# Patient Record
Sex: Male | Born: 1970
Health system: Southern US, Community
[De-identification: ages and names within clinical notes are randomized; demographics above are authoritative.]

## PROBLEM LIST (undated history)

## (undated) DIAGNOSIS — G4730 Sleep apnea, unspecified: Secondary | ICD-10-CM

## (undated) DIAGNOSIS — I10 Essential (primary) hypertension: Secondary | ICD-10-CM

## (undated) HISTORY — DX: Sleep apnea, unspecified: G47.30

---

## 2015-12-05 ENCOUNTER — Emergency Department
Admission: EM | Admit: 2015-12-05 | Discharge: 2015-12-05 | Disposition: A | Discharge status: Home / Self Care | Payer: Worker's Compensation | Attending: Emergency Medicine | Admitting: Emergency Medicine

## 2015-12-05 ENCOUNTER — Encounter: Payer: Self-pay | Admitting: *Deleted

## 2015-12-05 DIAGNOSIS — H5711 Ocular pain, right eye: Secondary | ICD-10-CM | POA: Diagnosis present

## 2015-12-05 DIAGNOSIS — Y9389 Activity, other specified: Secondary | ICD-10-CM | POA: Insufficient documentation

## 2015-12-05 DIAGNOSIS — Y929 Unspecified place or not applicable: Secondary | ICD-10-CM | POA: Insufficient documentation

## 2015-12-05 DIAGNOSIS — T1501XA Foreign body in cornea, right eye, initial encounter: Secondary | ICD-10-CM | POA: Diagnosis not present

## 2015-12-05 DIAGNOSIS — Y999 Unspecified external cause status: Secondary | ICD-10-CM | POA: Diagnosis not present

## 2015-12-05 DIAGNOSIS — W458XXA Other foreign body or object entering through skin, initial encounter: Secondary | ICD-10-CM | POA: Diagnosis not present

## 2015-12-05 MED ORDER — FLUORESCEIN SODIUM 1 MG OP STRP
1.0000 | ORAL_STRIP | Freq: Once | OPHTHALMIC | Status: AC
  Administered 2015-12-05: 1 via OPHTHALMIC

## 2015-12-05 NOTE — ED Notes (Signed)
Right eye pain and redness. Thinks he got something in it on Friday.

## 2015-12-05 NOTE — Discharge Instructions (Signed)
Corneal Abrasion The cornea is the clear covering at the front and center of the eye. When you look at the colored portion of the eye, you are looking through the cornea. It is a thin tissue made up of layers. The top layer is the most sensitive layer. A corneal abrasion happens if this layer is scratched or an injury causes it to come off.  HOME CARE  You may be given drops or a medicated cream. Use the medicine as told by your doctor.  A pressure patch may be put over the eye. If this is done, follow your doctor's instructions for when to remove the patch. Do not drive or use machines while the eye patch is on. Judging distances is hard to do with a patch on.  See your doctor for a follow-up exam if you are told to do so. It is very important that you keep this appointment. GET HELP IF:   You have pain, are sensitive to light, and have a scratchy feeling in one eye or both eyes.  Your pressure patch keeps getting loose. You can blink your eye under the patch.  You have fluid coming from your eye or the lids stick together in the morning.  You have the same symptoms in the morning that you did with the first abrasion. This could be days, weeks, or months after the first abrasion healed.   This information is not intended to replace advice given to you by your health care provider. Make sure you discuss any questions you have with your health care provider.   Document Released: 12/20/2007 Document Revised: 03/24/2015 Document Reviewed: 03/10/2013 Elsevier Interactive Patient Education 2016 Elsevier Inc[employer]  Eye Foreign Body A foreign body is an object on or in the eye that should not be there. The object could be a speck of dirt or dust, a hair, an eyelash, a splinter, or any other object. HOME CARE  Take medicines only as told by your doctor. Use eye drops or ointment as told.  If no eye patch was put on:  Keep the eye closed as much as possible.  Do not rub the eye.  Wear dark  glasses in bright light.  Do not wear contact lenses until the eye feels normal, or as told by your doctor.  Wear protective eye covering when needed, especially when using high-speed tools.  If your eye is patched:  Follow your doctor's instructions for when to remove the patch.  Do notdrive or use machines while the eye patch is on. Judging distances is hard to do while wearing a patch.  Keep all follow-up visits as told by your doctor. This is important. GET HELP IF:   Your pain gets worse.  Your vision gets worse.  You have problems with your eye patch.  You have fluid (discharge) coming from your eye.  You have redness and swelling around your eye. MAKE SURE YOU:   Understand these instructions.  Will watch your condition.  Will get help right away if you are not doing well or get worse.   This information is not intended to replace advice given to you by your health care provider. Make sure you discuss any questions you have with your health care provider.   Document Released: 12/21/2009 Document Revised: 07/24/2014 Document Reviewed: 11/28/2012 Elsevier Interactive Patient Education 2016 [employer].   You have a retained metallic foreign body to the cornea. Follow-up with Dr. Inez Pilgrim, directly for further treatment. Report to the back door  of the office and await the provider.

## 2015-12-05 NOTE — ED Notes (Signed)
Patient c/o right eye pain that began at work on Friday and has progressed since. Patient c/o photophobia and dried secretions in the morning.

## 2015-12-09 NOTE — ED Provider Notes (Signed)
[facility] Emergency Department Provider Note ____________________________________________  Time seen: [id number]  I have reviewed the triage vital signs and the nursing notes.  HISTORY  Chief Complaint  Eye Pain   HPI Kyle Miles is a 45 y.o. male presents to the ED with right eye pain with onset on Friday, 2 days prior to arrival. The patient describes he was working when he got an acute foreign body sensation to the right eye. He attempted unsuccessfully to flush the eye and has had ongoing, intermittent eye irritation, tearing, redness, and photophobia. He denies any headache pain, nausea, vomiting, visual disturbance. He rates his discomfort at 8/10 in triage.  History reviewed. No pertinent past medical history.  There are no active problems to display for this patient.  History reviewed. No pertinent past surgical history.  No current outpatient prescriptions on file.  Allergies Review of patient's allergies indicates no known allergies.  No family history on file.  Social History Social History  Substance Use Topics  . Smoking status: Never Smoker   . Smokeless tobacco: None  . Alcohol Use: No    Review of Systems  Constitutional: Negative for fever. Eyes: Negative for visual changes. Right eye FBS.  ENT: Negative for sore throat. Skin: Negative for rash. Neurological: Negative for headaches, focal weakness or numbness. ____________________________________________  PHYSICAL EXAM:  VITAL SIGNS: ED Triage Vitals  Enc Vitals Group     BP 12/05/15 1457 140/105 mmHg     Pulse Rate 12/05/15 1457 78     Resp 12/05/15 1457 18     Temp 12/05/15 1457 98.9 F (37.2 C)     Temp Source 12/05/15 1457 Oral     SpO2 12/05/15 1457 97 %     Weight 12/05/15 1457 265 lb (120.203 kg)     Height 12/05/15 1457 5\' 9"  (1.753 m)     Head Cir --      Peak Flow --      Pain Score 12/05/15 1458 8     Pain Loc --      Pain Edu? --      Excl. in GC? --      Constitutional: Alert and oriented. Well appearing and in no distress. Head: Normocephalic and atraumatic. Eyes: Conjunctivae are normal except on the right. PERRL. Normal extraocular movements. Right eye with a obvious retained foreign body to the cornea. After several drops of tetracaine. The foreign body is partially removed using a definite, sterile cotton swab. An unsuccessful attempt using the bevel of an 18-gauge needle is rendered. Musculoskeletal: Nontender with normal range of motion in all extremities.  Neurologic:  Normal gait without ataxia. Normal speech and language. No gross focal neurologic deficits are appreciated. Skin:  Skin is warm, dry and intact. No rash noted. ____________________________________________  PROCEDURES  Tetracaine iii gtts OD ____________________________________________  INITIAL IMPRESSION / ASSESSMENT AND PLAN / ED COURSE  Patient with 2 days of foreign body sensation to the right eye. He has a gross foreign body to the cornea on the right. Unsuccessful attempts to lift the foreign body in the ED. Patient will be evaluated directly by Dr. Inez PilgrimBrasington at [facility]. Patient is discharged with directions to report to the office for further management.  ____________________________________________  FINAL CLINICAL IMPRESSION(S) / ED DIAGNOSES  Final diagnoses:  Corneal foreign body with residual material, right, initial encounter     Lissa HoardJenise V Bacon Reisha Wos, PA-C 12/09/15 1855  Jene Everyobert Kinner, MD 12/10/15 1743

## 2016-02-16 ENCOUNTER — Ambulatory Visit: Payer: Self-pay | Admitting: Urology

## 2016-02-22 ENCOUNTER — Encounter: Payer: Self-pay | Admitting: Urology

## 2016-02-22 ENCOUNTER — Ambulatory Visit (INDEPENDENT_AMBULATORY_CARE_PROVIDER_SITE_OTHER): Payer: BLUE CROSS/BLUE SHIELD | Admitting: Urology

## 2016-02-22 VITALS — BP 145/85 | HR 76 | Ht 69.0 in | Wt 267.5 lb

## 2016-02-22 DIAGNOSIS — E291 Testicular hypofunction: Secondary | ICD-10-CM

## 2016-02-22 NOTE — Progress Notes (Signed)
02/22/2016 3:33 PM   Kyle LauthGreg Miles 13-Apr-1971 [id number]  Referring provider: Derwood KaplanErnest B Eason, MD [address] [place], [place] [address]  Chief Complaint  Patient presents with  . Erectile Dysfunction    HPI: Kyle Dan HumphreysMebane is a 45yo seen in consultation today for  Erectile dysfunction. It has been present for the past 6-7 months. He gets fatigued easily. He had testosterone drawn and per patient is was around 130.  He has issues getting and maintaining an erection. He has decreased libido.  He does not have an issue with ejaculation.  He has issue with nocturia 2-3x. He drinks 8oz within 2 hours of bedtime. He has not been checked for sleep apnea.  He takes diet pill on occasion but he has not taken any in the past week.  No family hx of CAD.   PMH: No past medical history on file.  Surgical History: No past surgical history on file.  Home Medications:    Medication List       Accurate as of 02/22/16  3:33 PM. Always use your most recent med list.          Cinnamon 500 MG capsule Take 500 mg by mouth daily.   Fish Oil 1000 MG Caps Take by mouth.   multivitamin tablet Take 1 tablet by mouth daily.   phentermine 37.5 MG capsule Take 37.5 mg by mouth every morning.       Allergies: No Known Allergies  Family History: Family History  Problem Relation Age of Onset  . Kidney cancer Neg Hx   . Kidney disease Neg Hx   . Prostate cancer Neg Hx     Social History:  reports that he has never smoked. He does not have any smokeless tobacco history on file. He reports that he does not drink alcohol. His drug history is not on file.  ROS: UROLOGY Frequent Urination?: Yes Hard to postpone urination?: No Burning/pain with urination?: No Get up at night to urinate?: Yes Leakage of urine?: No Urine stream starts and stops?: Yes Trouble starting stream?: No Do you have to strain to urinate?: No Blood in urine?: No Urinary tract infection?: No Sexually transmitted  disease?: No Injury to kidneys or bladder?: No Painful intercourse?: No Weak stream?: No Erection problems?: Yes Penile pain?: No  Gastrointestinal Nausea?: No Vomiting?: No Indigestion/heartburn?: No Diarrhea?: No Constipation?: No  Constitutional Fever: No Night sweats?: No Weight loss?: No Fatigue?: Yes  Skin Skin rash/lesions?: No Itching?: No  Eyes Blurred vision?: No Double vision?: No  Ears/Nose/Throat Sore throat?: No Sinus problems?: No  Hematologic/Lymphatic Swollen glands?: No Easy bruising?: No  Cardiovascular Leg swelling?: No Chest pain?: No  Respiratory Cough?: No Shortness of breath?: No  Endocrine Excessive thirst?: No  Musculoskeletal Back pain?: Yes Joint pain?: Yes  Neurological Headaches?: No Dizziness?: No  Psychologic Depression?: No Anxiety?: No  Physical Exam: BP (!) 145/85   Pulse 76   Ht 5\' 9"  (1.753 m)   Wt 121.3 kg (267 lb 8 oz)   BMI 39.50 kg/m   Constitutional:  Alert and oriented, No acute distress. HEENT: [place] AT, moist mucus membranes.  Trachea midline, no masses. Cardiovascular: No clubbing, cyanosis, or edema. Respiratory: Normal respiratory effort, no increased work of breathing. GI: Abdomen is soft, nontender, nondistended, no abdominal masses GU: No CVA tenderness. Circumcised phallus. No masses/lesions on testes/penis/scrotum Skin: No rashes, bruises or suspicious lesions. Lymph: No cervical or inguinal adenopathy. Neurologic: Grossly intact, no focal deficits, moving all 4 extremities.  Psychiatric: Normal mood and affect.  Laboratory Data: No results found for: WBC, HGB, HCT, MCV, PLT  No results found for: CREATININE  No results found for: PSA  No results found for: TESTOSTERONE  No results found for: HGBA1C  Urinalysis No results found for: COLORURINE, APPEARANCEUR, LABSPEC, PHURINE, GLUCOSEU, HGBUR, BILIRUBINUR, KETONESUR, PROTEINUR, UROBILINOGEN, NITRITE, LEUKOCYTESUR  Pertinent  Imaging: none  Assessment & Plan:   1. ED   2. Hypogonadism -testosterone free and total, if low will pursue workup -RTC 1-2 weeks  3. Nocturia -fluid management -Pt needs to be evaluated for sleep apnea as a cause of his nocturia  There are no diagnoses linked to this encounter.  No Follow-up on file.  Wilkie Aye, MD  [facility] [address] [place], [place] [address] [phone]

## 2016-02-23 ENCOUNTER — Other Ambulatory Visit: Payer: Self-pay

## 2016-02-23 DIAGNOSIS — E291 Testicular hypofunction: Secondary | ICD-10-CM

## 2016-02-24 ENCOUNTER — Other Ambulatory Visit: Payer: BLUE CROSS/BLUE SHIELD

## 2016-02-24 DIAGNOSIS — E291 Testicular hypofunction: Secondary | ICD-10-CM

## 2016-02-26 LAB — TESTOSTERONE,FREE AND TOTAL
TESTOSTERONE FREE: 15.2 pg/mL (ref 6.8–21.5)
Testosterone: 230 ng/dL — ABNORMAL LOW (ref 264–916)

## 2016-03-14 ENCOUNTER — Ambulatory Visit (INDEPENDENT_AMBULATORY_CARE_PROVIDER_SITE_OTHER): Payer: BLUE CROSS/BLUE SHIELD | Admitting: Urology

## 2016-03-14 VITALS — BP 145/79 | HR 91 | Ht 69.0 in | Wt 264.0 lb

## 2016-03-14 DIAGNOSIS — E291 Testicular hypofunction: Secondary | ICD-10-CM | POA: Diagnosis not present

## 2016-03-14 DIAGNOSIS — R351 Nocturia: Secondary | ICD-10-CM | POA: Diagnosis not present

## 2016-03-14 NOTE — Progress Notes (Signed)
03/14/2016 11:59 AM   Leida LauthGreg Bagot [birth year] [id number]  Referring provider: Derwood KaplanErnest B Eason, MD [address] [place], [place] [address]  Chief Complaint  Patient presents with  . Hypogonadism    2wk follow up    HPI:  Mr Kyle HumphreysMebane is a 45yo seen in consultation today for  Erectile dysfunction. It has been present for the past 6-7 months. He gets fatigued easily. He had testosterone drawn and per patient is was around 130.  He has issues getting and maintaining an erection. He has decreased libido.  He does not have an issue with ejaculation.  He has issue with nocturia 2-3x. He drinks 8oz within 2 hours of bedtime. He has not been checked for sleep apnea.  He takes diet pill on occasion but he has not taken any in the past week.  No family hx of CAD.  Interval Hx: -His testosterone was 230. He just had his sleep study last night and has an appointment.   PMH: No past medical history on file.  Surgical History: No past surgical history on file.  Home Medications:    Medication List       Accurate as of 03/14/16 11:59 AM. Always use your most recent med list.          Cinnamon 500 MG capsule Take 500 mg by mouth daily.   Fish Oil 1000 MG Caps Take by mouth.   multivitamin tablet Take 1 tablet by mouth daily.   phentermine 37.5 MG capsule Take 37.5 mg by mouth every morning.       Allergies: No Known Allergies  Family History: Family History  Problem Relation Age of Onset  . Kidney cancer Neg Hx   . Kidney disease Neg Hx   . Prostate cancer Neg Hx     Social History:  reports that he has never smoked. He does not have any smokeless tobacco history on file. He reports that he does not drink alcohol. His drug history is not on file.  ROS: UROLOGY Frequent Urination?: No Hard to postpone urination?: No Burning/pain with urination?: No Get up at night to urinate?: No Leakage of urine?: No Urine stream starts and stops?: No Trouble starting  stream?: No Do you have to strain to urinate?: No Blood in urine?: No Urinary tract infection?: No Sexually transmitted disease?: No Injury to kidneys or bladder?: No Painful intercourse?: No Weak stream?: No Erection problems?: No Penile pain?: No  Gastrointestinal Nausea?: No Vomiting?: No Indigestion/heartburn?: No Diarrhea?: No Constipation?: No  Constitutional Fever: No Night sweats?: No Weight loss?: No Fatigue?: No  Skin Skin rash/lesions?: No Itching?: No  Eyes Blurred vision?: No Double vision?: No  Ears/Nose/Throat Sore throat?: No Sinus problems?: No  Hematologic/Lymphatic Swollen glands?: No Easy bruising?: No  Cardiovascular Leg swelling?: No Chest pain?: No  Respiratory Cough?: No Shortness of breath?: No  Endocrine Excessive thirst?: No  Musculoskeletal Back pain?: No Joint pain?: No  Neurological Headaches?: No Dizziness?: No  Psychologic Depression?: No Anxiety?: No  Physical Exam: BP (!) 145/79   Pulse 91   Ht 5\' 9"  (1.753 m)   Wt 119.7 kg (264 lb)   BMI 38.99 kg/m   Constitutional:  Alert and oriented, No acute distress. HEENT: [place] AT, moist mucus membranes.  Trachea midline, no masses. Cardiovascular: No clubbing, cyanosis, or edema. Respiratory: Normal respiratory effort, no increased work of breathing. GI: Abdomen is soft, nontender, nondistended, no abdominal masses GU: No CVA tenderness.  Skin: No rashes, bruises or suspicious  lesions. Lymph: No cervical or inguinal adenopathy. Neurologic: Grossly intact, no focal deficits, moving all 4 extremities. Psychiatric: Normal mood and affect.  Laboratory Data: No results found for: WBC, HGB, HCT, MCV, PLT  No results found for: CREATININE  No results found for: PSA  Lab Results  Component Value Date   TESTOSTERONE 230 (L) 02/24/2016    No results found for: HGBA1C  Urinalysis No results found for: COLORURINE, APPEARANCEUR, LABSPEC, PHURINE, GLUCOSEU,  HGBUR, BILIRUBINUR, KETONESUR, PROTEINUR, UROBILINOGEN, NITRITE, LEUKOCYTESUR  Pertinent Imaging: none  Assessment & Plan:   1. Hypogonadism -prolactin, estradiol, LH, TSH  We discussed TRT and will await labs The patient wishes to proceed with clomiphene versus injections   There are no diagnoses linked to this encounter.  No Follow-up on file.  Wilkie Aye, MD  [facility] [address], [address] [place], [place] [address] [phone]

## 2016-03-14 NOTE — Addendum Note (Signed)
Addended by: Martha ClanWATTS, Iliana Hutt M on: 03/14/2016 01:45 PM   Modules accepted: Orders

## 2016-03-15 LAB — TSH: TSH: 3.01 u[IU]/mL (ref 0.450–4.500)

## 2016-03-15 LAB — ESTRADIOL: Estradiol: 9.5 pg/mL (ref 7.6–42.6)

## 2016-03-15 LAB — PROLACTIN: Prolactin: 10.1 ng/mL (ref 4.0–15.2)

## 2016-03-15 LAB — LUTEINIZING HORMONE: LH: 6.5 m[IU]/mL (ref 1.7–8.6)

## 2016-04-26 ENCOUNTER — Emergency Department
Admission: EM | Admit: 2016-04-26 | Discharge: 2016-04-26 | Disposition: A | Discharge status: Home / Self Care | Payer: BLUE CROSS/BLUE SHIELD | Attending: Emergency Medicine | Admitting: Emergency Medicine

## 2016-04-26 ENCOUNTER — Encounter: Payer: Self-pay | Admitting: Emergency Medicine

## 2016-04-26 DIAGNOSIS — S39012A Strain of muscle, fascia and tendon of lower back, initial encounter: Secondary | ICD-10-CM | POA: Insufficient documentation

## 2016-04-26 DIAGNOSIS — Y999 Unspecified external cause status: Secondary | ICD-10-CM | POA: Insufficient documentation

## 2016-04-26 DIAGNOSIS — Y939 Activity, unspecified: Secondary | ICD-10-CM | POA: Diagnosis not present

## 2016-04-26 DIAGNOSIS — S3992XA Unspecified injury of lower back, initial encounter: Secondary | ICD-10-CM | POA: Diagnosis present

## 2016-04-26 DIAGNOSIS — Y92410 Unspecified street and highway as the place of occurrence of the external cause: Secondary | ICD-10-CM | POA: Diagnosis not present

## 2016-04-26 MED ORDER — CYCLOBENZAPRINE HCL 5 MG PO TABS: 5.0000 mg | ORAL_TABLET | Freq: Three times a day (TID) | ORAL | 0 refills | Status: DC | PRN

## 2016-04-26 MED ORDER — NAPROXEN 500 MG PO TABS: 500.0000 mg | ORAL_TABLET | Freq: Two times a day (BID) | ORAL | 0 refills | Status: DC
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
## 2019-03-19 DIAGNOSIS — Z03818 Encounter for observation for suspected exposure to other biological agents ruled out: Secondary | ICD-10-CM | POA: Diagnosis not present

## 2019-06-20 ENCOUNTER — Emergency Department: Payer: No Typology Code available for payment source

## 2019-06-20 ENCOUNTER — Encounter: Payer: Self-pay | Admitting: Emergency Medicine

## 2019-06-20 ENCOUNTER — Emergency Department
Admission: EM | Admit: 2019-06-20 | Discharge: 2019-06-20 | Disposition: A | Discharge status: Home / Self Care | Payer: No Typology Code available for payment source | Attending: Emergency Medicine | Admitting: Emergency Medicine

## 2019-06-20 ENCOUNTER — Other Ambulatory Visit: Payer: Self-pay

## 2019-06-20 DIAGNOSIS — Z79899 Other long term (current) drug therapy: Secondary | ICD-10-CM | POA: Insufficient documentation

## 2019-06-20 DIAGNOSIS — Y990 Civilian activity done for income or pay: Secondary | ICD-10-CM | POA: Insufficient documentation

## 2019-06-20 DIAGNOSIS — W231XXA Caught, crushed, jammed, or pinched between stationary objects, initial encounter: Secondary | ICD-10-CM | POA: Diagnosis not present

## 2019-06-20 DIAGNOSIS — Y929 Unspecified place or not applicable: Secondary | ICD-10-CM | POA: Diagnosis not present

## 2019-06-20 DIAGNOSIS — S61211A Laceration without foreign body of left index finger without damage to nail, initial encounter: Secondary | ICD-10-CM | POA: Diagnosis not present

## 2019-06-20 DIAGNOSIS — Y939 Activity, unspecified: Secondary | ICD-10-CM | POA: Insufficient documentation

## 2019-06-20 MED ORDER — LIDOCAINE HCL (PF) 1 % IJ SOLN
2.0000 mL | Freq: Once | INTRAMUSCULAR | Status: AC
  Administered 2019-06-20: 2 mL via INTRADERMAL
  Filled 2019-06-20: qty 5

## 2019-06-20 MED ORDER — LIDOCAINE HCL (PF) 1 % IJ SOLN
INTRAMUSCULAR | Status: AC
  Filled 2019-06-20: qty 5

## 2019-06-20 MED ORDER — CEPHALEXIN 500 MG PO CAPS
500.0000 mg | ORAL_CAPSULE | Freq: Once | ORAL | Status: AC
  Administered 2019-06-20: 500 mg via ORAL
  Filled 2019-06-20: qty 1

## 2019-06-20 MED ORDER — CEPHALEXIN 500 MG PO CAPS: 500.0000 mg | ORAL_CAPSULE | Freq: Two times a day (BID) | ORAL | 0 refills | Status: DC

## 2019-06-20 NOTE — ED Notes (Signed)
Dressing and splint applied to pt finger.

## 2019-06-20 NOTE — ED Provider Notes (Signed)
[facility] Emergency Department Provider Note    First MD Initiated Contact with Patient 06/20/19 0150     (approximate)  I have reviewed the triage vital signs and the nursing notes.   HISTORY  Chief Complaint Finger Injury    HPI Kyle Miles is a 48 y.o. male below list of previous medical conditions presents emergency department secondary to left index finger laceration which he sustained while at work.  Patient states that his finger got caught between the machinery at work resulting in the laceration.  Patient states that current pain score is 9 out of 10 and worse with any movement of the finger.        History reviewed. No pertinent past medical history.  Patient Active Problem List   Diagnosis Date Noted  . Hypogonadism in male 03/14/2016  . Nocturia 03/14/2016    History reviewed. No pertinent surgical history.  Prior to Admission medications   Medication Sig Start Date End Date Taking? Authorizing Provider  Cinnamon 500 MG capsule Take 500 mg by mouth daily.    [provider]  cyclobenzaprine (FLEXERIL) 5 MG tablet Take 1 tablet (5 mg total) by mouth 3 (three) times daily as needed for muscle spasms. 04/26/16   Hagler, Jami L, PA-C  Multiple Vitamin (MULTIVITAMIN) tablet Take 1 tablet by mouth daily.    [provider]  naproxen (NAPROSYN) 500 MG tablet Take 1 tablet (500 mg total) by mouth 2 (two) times daily with a meal. 04/26/16   Hagler, Jami L, PA-C  Omega-3 Fatty Acids (FISH OIL) 1000 MG CAPS Take by mouth.    [provider]  phentermine 37.5 MG capsule Take 37.5 mg by mouth every morning.    [provider]    Allergies Patient has no known allergies.  Family History  Problem Relation Age of Onset  . Kidney cancer Neg Hx   . Kidney disease Neg Hx   . Prostate cancer Neg Hx     Social History Social History   Tobacco Use  . Smoking status: Never Smoker  . Smokeless tobacco:  Never Used  Substance Use Topics  . Alcohol use: No  . Drug use: Not on file    Review of Systems Constitutional: No fever/chills Eyes: No visual changes. ENT: No sore throat. Cardiovascular: Denies chest pain. Respiratory: Denies shortness of breath. Gastrointestinal: No abdominal pain.  No nausea, no vomiting.  No diarrhea.  No constipation. Genitourinary: Negative for dysuria. Musculoskeletal: Negative for neck pain.  Negative for back pain. Integumentary: Negative for rash.  Positive for left index finger laceration. Neurological: Negative for headaches, focal weakness or numbness.   ____________________________________________   PHYSICAL EXAM:  VITAL SIGNS: ED Triage Vitals [06/20/19 0029]  Enc Vitals Group     BP      Pulse      Resp      Temp      Temp src      SpO2      Weight      Height      Head Circumference      Peak Flow      Pain Score 9     Pain Loc      Pain Edu?      Excl. in [place]?     Constitutional: Alert and oriented.  Eyes: Conjunctivae are normal.  Mouth/Throat: Patient is wearing a mask. Neck: No stridor.  No meningeal signs.   Musculoskeletal: No lower extremity tenderness nor  edema. No gross deformities of extremities. Neurologic:  Normal speech and language. No gross focal neurologic deficits are appreciated.  Skin: Linear vertical 3 cm laceration dorsal aspect of the left index finger Psychiatric: Mood and affect are normal. Speech and behavior are normal.  RADIOLOGY I, [place], personally viewed and evaluated these images (plain radiographs) as part of my medical decision making, as well as reviewing the written report by the radiologist.  ED MD interpretation: Soft tissue injury with no acute fracture on left index finger x-ray per radiologist.  Official radiology report(s): Dg Finger Index Left  Result Date: 06/20/2019 CLINICAL DATA:  48 year old male status post crush injury to index finger. Laceration. EXAM: LEFT  INDEX FINGER 2+V COMPARISON:  None. FINDINGS: Bone mineralization is within normal limits. There is no evidence of fracture or dislocation. Joint spaces are normal for age. Soft tissue injury and swelling along the dorsal aspect of the 2nd DIP. No soft tissue gas. No radiopaque foreign body identified. IMPRESSION: Soft tissue injury with no acute fracture or dislocation identified about the left index finger. Electronically Signed   By: Odessa Fleming M.D.   On: 06/20/2019 00:52    ____________________________________________   PROCEDURES   .Marland KitchenLaceration Repair  Date/Time: 06/20/2019 2:34 AM Performed by: Darci Current, MD Authorized by: Darci Current, MD   Consent:    Consent obtained:  Verbal   Consent given by:  Patient   Risks discussed:  Infection, pain, retained foreign body, poor cosmetic result and poor wound healing Anesthesia (see MAR for exact dosages):    Anesthesia method:  Local infiltration   Local anesthetic:  Lidocaine 1% w/o epi Laceration details:    Length (cm):  3 Repair type:    Repair type:  Simple Exploration:    Hemostasis achieved with:  Direct pressure   Wound exploration: entire depth of wound probed and visualized     Contaminated: no   Treatment:    Area cleansed with:  Saline and Betadine   Amount of cleaning:  Extensive   Irrigation solution:  Sterile saline   Visualized foreign bodies/material removed: no   Skin repair:    Repair method:  Sutures   Suture size:  4-0   Suture material:  Nylon   Suture technique:  Simple interrupted   Number of sutures:  3 Approximation:    Approximation:  Close Post-procedure details:    Dressing:  Sterile dressing, antibiotic ointment and splint for protection   Patient tolerance of procedure:  Tolerated well, no immediate complications     ____________________________________________   INITIAL IMPRESSION / MDM / ASSESSMENT AND PLAN / ED COURSE  As part of my medical decision making, I reviewed  the following data within the electronic MEDICAL RECORD NUMBER   48 year old male presented with above-stated history and physical exam secondary to left index finger laceration which was repaired without difficulty.  ____________________________________________  FINAL CLINICAL IMPRESSION(S) / ED DIAGNOSES  Final diagnoses:  Laceration of left index finger without foreign body without damage to nail, initial encounter     MEDICATIONS GIVEN DURING THIS VISIT:  Medications  lidocaine (PF) (XYLOCAINE) 1 % injection 2 mL (2 mLs Intradermal Given 06/20/19 0222)  cephALEXin (KEFLEX) capsule 500 mg (500 mg Oral Given 06/20/19 0221)     ED Discharge Orders    None      *Please note:  Kyle Miles was evaluated in Emergency Department on 06/20/2019 for the symptoms described in the history of present illness. He  was evaluated in the context of the global COVID-19 pandemic, which necessitated consideration that the patient might be at risk for infection with the SARS-CoV-2 virus that causes COVID-19. Institutional protocols and algorithms that pertain to the evaluation of patients at risk for COVID-19 are in a state of rapid change based on information released by regulatory bodies including the CDC and federal and state organizations. These policies and algorithms were followed during the patient's care in the ED.  Some ED evaluations and interventions may be delayed as a result of limited staffing during the pandemic.*  Note:  This document was prepared using Dragon voice recognition software and may include unintentional dictation errors.   Darci CurrentBrown, [place] N, MD 06/20/19 [phone]

## 2019-06-20 NOTE — ED Triage Notes (Addendum)
Patient ambulatory to triage with steady gait, without difficulty or distress noted; pt employed with [employer] ([employer] required per workers comp profile); pt reports while at work PTA injured left index finger under a cylinder; approx 1/2 vertical lac noted just below fingernail with scant bleeding

## 2019-06-27 ENCOUNTER — Encounter: Payer: Self-pay | Admitting: Intensive Care

## 2019-06-27 ENCOUNTER — Other Ambulatory Visit: Payer: Self-pay

## 2019-06-27 ENCOUNTER — Emergency Department
Admission: EM | Admit: 2019-06-27 | Discharge: 2019-06-27 | Disposition: A | Discharge status: Home / Self Care | Payer: No Typology Code available for payment source | Attending: Emergency Medicine | Admitting: Emergency Medicine

## 2019-06-27 DIAGNOSIS — X58XXXD Exposure to other specified factors, subsequent encounter: Secondary | ICD-10-CM | POA: Diagnosis not present

## 2019-06-27 DIAGNOSIS — Z4802 Encounter for removal of sutures: Secondary | ICD-10-CM | POA: Diagnosis not present

## 2019-06-27 DIAGNOSIS — S61211D Laceration without foreign body of left index finger without damage to nail, subsequent encounter: Secondary | ICD-10-CM | POA: Diagnosis not present

## 2019-06-27 NOTE — ED Provider Notes (Signed)
[facility] Emergency Department Provider Note  ____________________________________________  Time seen: Approximately 2:24 PM  I have reviewed the triage vital signs and the nursing notes.   HISTORY  Chief Complaint Suture / Staple Removal    HPI Kyle Miles is a 48 y.o. male that presents to the emergency department for suture removal.  Patient denies any complaints or concerns with sutures.  He is taking antibiotics as prescribed.  History reviewed. No pertinent past medical history.  Patient Active Problem List   Diagnosis Date Noted  . Hypogonadism in male 03/14/2016  . Nocturia 03/14/2016    History reviewed. No pertinent surgical history.  Prior to Admission medications   Medication Sig Start Date End Date Taking? Authorizing Provider  cephALEXin (KEFLEX) 500 MG capsule Take 1 capsule (500 mg total) by mouth 2 (two) times daily for 7 days. 06/20/19 06/27/19  Darci Current, MD  Cinnamon 500 MG capsule Take 500 mg by mouth daily.    [provider]  cyclobenzaprine (FLEXERIL) 5 MG tablet Take 1 tablet (5 mg total) by mouth 3 (three) times daily as needed for muscle spasms. 04/26/16   Hagler, Jami L, PA-C  Multiple Vitamin (MULTIVITAMIN) tablet Take 1 tablet by mouth daily.    [provider]  naproxen (NAPROSYN) 500 MG tablet Take 1 tablet (500 mg total) by mouth 2 (two) times daily with a meal. 04/26/16   Hagler, Jami L, PA-C  Omega-3 Fatty Acids (FISH OIL) 1000 MG CAPS Take by mouth.    [provider]  phentermine 37.5 MG capsule Take 37.5 mg by mouth every morning.    [provider]    Allergies Patient has no known allergies.  Family History  Problem Relation Age of Onset  . Kidney cancer Neg Hx   . Kidney disease Neg Hx   . Prostate cancer Neg Hx     Social History Social History   Tobacco Use  . Smoking status: Never Smoker  . Smokeless tobacco: Never Used  Substance Use Topics  .  Alcohol use: No  . Drug use: Never     Review of Systems  Constitutional: No fever/chills Gastrointestinal:  No nausea, no vomiting.  Musculoskeletal: Negative for musculoskeletal pain. Skin: Negative for rash, ecchymosis.  Positive for sutured laceration. Neurological: Negative for numbness or tingling   ____________________________________________   PHYSICAL EXAM:  VITAL SIGNS: ED Triage Vitals  Enc Vitals Group     BP 06/27/19 1400 (!) 151/85     Pulse Rate 06/27/19 1400 67     Resp 06/27/19 1400 16     Temp 06/27/19 1400 99.3 F (37.4 C)     Temp Source 06/27/19 1400 Oral     SpO2 06/27/19 1400 96 %     Weight 06/27/19 1401 260 lb (117.9 kg)     Height 06/27/19 1401 5\' 9"  (1.753 m)     Head Circumference --      Peak Flow --      Pain Score 06/27/19 1401 3     Pain Loc --      Pain Edu? --      Excl. in [place]? --      Constitutional: Alert and oriented. Well appearing and in no acute distress. Eyes: Conjunctivae are normal. PERRL. EOMI. Head: Atraumatic. ENT:      Ears:      Nose: No congestion/rhinnorhea.      Mouth/Throat: Mucous membranes are moist.  Neck: No stridor.  Cardiovascular: Normal rate,  regular rhythm.  Good peripheral circulation. Respiratory: Normal respiratory effort without tachypnea or retractions. Lungs CTAB. Good air entry to the bases with no decreased or absent breath sounds. Musculoskeletal: Full range of motion to all extremities. No gross deformities appreciated. Neurologic:  Normal speech and language. No gross focal neurologic deficits are appreciated.  Skin:  Skin is warm, dry.  Sutured laceration to left index finger with 3 sutures in place.  No surrounding erythema or swelling.  No drainage. Psychiatric: Mood and affect are normal. Speech and behavior are normal. Patient exhibits appropriate insight and judgement.   ____________________________________________   LABS (all labs ordered are listed, but only abnormal results are  displayed)  Labs Reviewed - No data to display ____________________________________________  EKG   ____________________________________________  RADIOLOGY   No results found.  ____________________________________________    PROCEDURES  Procedure(s) performed:    Procedures  SUTURE REMOVAL Performed by: Kyle Miles  Consent: Verbal consent obtained. Patient identity confirmed: provided demographic data Time out: Immediately prior to procedure a "time out" was called to verify the correct patient, procedure, equipment, support staff and site/side marked as required.  Location details: finger  Wound Appearance: clean  Sutures/Staples Removed: 3  Facility: sutures placed in this facility Patient tolerance: Patient tolerated the procedure well with no immediate complications.     Medications - No data to display   ____________________________________________   INITIAL IMPRESSION / ASSESSMENT AND PLAN / ED COURSE  Pertinent labs & imaging results that were available during my care of the patient were reviewed by me and considered in my medical decision making (see chart for details).  Review of the [place] CSRS was performed in accordance of the [place] prior to dispensing any controlled drugs.     Patient presented to the emergency department for suture removal.  Vital signs and exam are reassuring.  Patient will continue his antibiotics.  Sutures were removed.  Splint was placed so that patient cannot bend his knuckle.  He will wear splint for 7 days to prevent reopening laceration.  Patient is to follow up with primary care as directed. Patient is given ED precautions to return to the ED for any worsening or new symptoms.  Kyle Miles was evaluated in Emergency Department on 06/27/2019 for the symptoms described in the history of present illness. He was evaluated in the context of the global COVID-19 pandemic, which necessitated consideration that the patient  might be at risk for infection with the SARS-CoV-2 virus that causes COVID-19. Institutional protocols and algorithms that pertain to the evaluation of patients at risk for COVID-19 are in a state of rapid change based on information released by regulatory bodies including the CDC and federal and state organizations. These policies and algorithms were followed during the patient's care in the ED.   ____________________________________________  FINAL CLINICAL IMPRESSION(S) / ED DIAGNOSES  Final diagnoses:  Visit for suture removal      NEW MEDICATIONS STARTED DURING THIS VISIT:  ED Discharge Orders    None          This chart was dictated using voice recognition software/Dragon. Despite best efforts to proofread, errors can occur which can change the meaning. Any change was purely unintentional.    Kyle Emperor, PA-C 06/27/19 1805    Earleen Newport, MD 07/02/19 [phone]

## 2019-06-27 NOTE — ED Notes (Signed)
See triage note  Presents to have sutures removed from finger

## 2019-06-27 NOTE — ED Triage Notes (Signed)
Patient here for stitch removal from left hand, index finger. This is workmans comp

## 2019-06-27 NOTE — Discharge Instructions (Signed)
Please wear finger splint for 1 week.

## 2019-07-04 ENCOUNTER — Other Ambulatory Visit: Payer: Self-pay

## 2019-07-04 ENCOUNTER — Encounter: Payer: Self-pay | Admitting: Emergency Medicine

## 2019-07-04 ENCOUNTER — Emergency Department
Admission: EM | Admit: 2019-07-04 | Discharge: 2019-07-04 | Disposition: A | Discharge status: Home / Self Care | Payer: No Typology Code available for payment source | Attending: Emergency Medicine | Admitting: Emergency Medicine

## 2019-07-04 DIAGNOSIS — Z4800 Encounter for change or removal of nonsurgical wound dressing: Secondary | ICD-10-CM | POA: Diagnosis present

## 2019-07-04 DIAGNOSIS — Z79899 Other long term (current) drug therapy: Secondary | ICD-10-CM | POA: Insufficient documentation

## 2019-07-04 DIAGNOSIS — Y990 Civilian activity done for income or pay: Secondary | ICD-10-CM | POA: Diagnosis not present

## 2019-07-04 DIAGNOSIS — Z5189 Encounter for other specified aftercare: Secondary | ICD-10-CM

## 2019-07-04 NOTE — Discharge Instructions (Addendum)
Continue to keep the area clean and dry.  Watch for any signs of infection.  Allow the finger to get plenty of air and not to wear airtight bandages constantly.  Follow-up with your primary care provider if any continued problems.

## 2019-07-04 NOTE — ED Triage Notes (Signed)
Pt had worker comp injury on 12/3.  Came for stitches to come out and reports they told him to keep eye on it because of "how it looked".  Pt denies any problems with finger but because they mentioned that he wanted to get it checked to be safe. No fever, no purulent drainage.

## 2019-07-04 NOTE — ED Provider Notes (Signed)
[facility] Emergency Department Provider Note  ____________________________________________   First MD Initiated Contact with Patient 07/04/19 1411     (approximate)  I have reviewed the triage vital signs and the nursing notes.   HISTORY  Chief Complaint Finger Injury   HPI Kyle Miles is a 48 y.o. male presents to the ED for wound check.  Patient states that he had a warm his comp injury on 06/19/2019.  He states stitches came out but was told to follow-up with the ED to have it looked at.  Patient denies any fever, chills, nausea or vomiting.  There is been no redness or drainage to his knowledge.       History reviewed. No pertinent past medical history.  Patient Active Problem List   Diagnosis Date Noted  . Hypogonadism in male 03/14/2016  . Nocturia 03/14/2016    History reviewed. No pertinent surgical history.  Prior to Admission medications   Medication Sig Start Date End Date Taking? Authorizing Provider  Cinnamon 500 MG capsule Take 500 mg by mouth daily.    [provider]  cyclobenzaprine (FLEXERIL) 5 MG tablet Take 1 tablet (5 mg total) by mouth 3 (three) times daily as needed for muscle spasms. 04/26/16   Hagler, Jami L, PA-C  Multiple Vitamin (MULTIVITAMIN) tablet Take 1 tablet by mouth daily.    [provider]  naproxen (NAPROSYN) 500 MG tablet Take 1 tablet (500 mg total) by mouth 2 (two) times daily with a meal. 04/26/16   Hagler, Jami L, PA-C  Omega-3 Fatty Acids (FISH OIL) 1000 MG CAPS Take by mouth.    [provider]  phentermine 37.5 MG capsule Take 37.5 mg by mouth every morning.    [provider]    Allergies Patient has no known allergies.  Family History  Problem Relation Age of Onset  . Kidney cancer Neg Hx   . Kidney disease Neg Hx   . Prostate cancer Neg Hx     Social History Social History   Tobacco Use  . Smoking status: Never Smoker  . Smokeless tobacco: Never  Used  Substance Use Topics  . Alcohol use: No  . Drug use: Never    Review of Systems Constitutional: No fever/chills Cardiovascular: Denies chest pain. Respiratory: Denies shortness of breath. Gastrointestinal: No abdominal pain.  No nausea, no vomiting. Musculoskeletal: Negative for back pain. Skin: Resolving wound left index finger. Neurological: Negative for headaches, focal weakness or numbness. ____________________________________________   PHYSICAL EXAM:  VITAL SIGNS: ED Triage Vitals  Enc Vitals Group     BP 07/04/19 1310 (!) 156/81     Pulse Rate 07/04/19 1310 64     Resp 07/04/19 1310 18     Temp 07/04/19 1310 97.7 F (36.5 C)     Temp Source 07/04/19 1310 Oral     SpO2 07/04/19 1310 98 %     Weight 07/04/19 1310 260 lb (117.9 kg)     Height 07/04/19 1310 5\' 9"  (1.753 m)     Head Circumference --      Peak Flow --      Pain Score 07/04/19 1350 4     Pain Loc --      Pain Edu? --      Excl. in [place]? --    Constitutional: Alert and oriented. Well appearing and in no acute distress. Eyes: Conjunctivae are normal.  Head: Atraumatic. Neck: No stridor.   Cardiovascular: Normal rate, regular rhythm. Grossly normal heart  sounds.  Good peripheral circulation. Respiratory: Normal respiratory effort.  No retractions. Lungs CTAB. Musculoskeletal: Resolving wound dorsal aspect of left index finger.  No active drainage.  Range of motion is slow secondary to patient having worn a splint from his last visit.  He was encouraged to discontinue wearing the splint. Neurologic:  Normal speech and language. No gross focal neurologic deficits are appreciated. No gait instability. Skin:  Skin is warm, dry and intact.  No erythema or edema present to left index finger. Psychiatric: Mood and affect are normal. Speech and behavior are normal.  ____________________________________________   LABS (all labs ordered are listed, but only abnormal results are displayed)  Labs Reviewed -  No data to display  PROCEDURES  Procedure(s) performed (including Critical Care):  Procedures   ____________________________________________   INITIAL IMPRESSION / ASSESSMENT AND PLAN / ED COURSE  As part of my medical decision making, I reviewed the following data within the electronic MEDICAL RECORD NUMBER Notes from prior ED visits and [place] Controlled Substance Database  48 year old male presents to the ED for a recheck of his wound that occurred on 06/19/2019 to his left index finger.  Patient states that the sutures came out.  He was told to still follow-up to make sure that it is healing well.  Patient is wearing a very airtight bandage and we discussed allowing his finger to get air when he is not working to help speed up the healing process.  Otherwise he is to continue keeping it clean and dry.  He will return to the ED if any severe worsening of his symptoms.   ____________________________________________   FINAL CLINICAL IMPRESSION(S) / ED DIAGNOSES  Final diagnoses:  Encounter for wound re-check     ED Discharge Orders    None       Note:  This document was prepared using Dragon voice recognition software and may include unintentional dictation errors.    Johnn Hai, PA-C 07/04/19 1426    Harvest Dark, MD 07/04/19 [phone]

## 2019-08-03 ENCOUNTER — Emergency Department
Admission: EM | Admit: 2019-08-03 | Discharge: 2019-08-03 | Disposition: A | Discharge status: Home / Self Care | Payer: 59 | Attending: Student in an Organized Health Care Education/Training Program | Admitting: Student in an Organized Health Care Education/Training Program

## 2019-08-03 ENCOUNTER — Encounter: Payer: Self-pay | Admitting: Intensive Care

## 2019-08-03 ENCOUNTER — Other Ambulatory Visit: Payer: Self-pay

## 2019-08-03 DIAGNOSIS — Z79899 Other long term (current) drug therapy: Secondary | ICD-10-CM | POA: Diagnosis not present

## 2019-08-03 DIAGNOSIS — R197 Diarrhea, unspecified: Secondary | ICD-10-CM | POA: Diagnosis not present

## 2019-08-03 DIAGNOSIS — Z20822 Contact with and (suspected) exposure to covid-19: Secondary | ICD-10-CM | POA: Diagnosis not present

## 2019-08-03 LAB — SARS CORONAVIRUS 2 (TAT 6-24 HRS): SARS Coronavirus 2: NEGATIVE

## 2019-08-03 NOTE — ED Provider Notes (Addendum)
[facility] Emergency Department Provider Note   ____________________________________________   First MD Initiated Contact with Patient 08/03/19 1154     (approximate)  I have reviewed the triage vital signs and the nursing notes.   HISTORY  Chief Complaint Diarrhea   HPI Kyle Miles is a 49 y.o. male presents to the ED with complaint of possible Covid.  Patient states that he has not been feeling well and for the last 2 days he has had intermittent diarrhea possibly as much as 3 times per day.  Patient is unaware of any fever and denies chills.  There is been no change in taste or smell.  He states that his wife is also here to be seen and she was exposed to someone who did have Covid.      History reviewed. No pertinent past medical history.  Patient Active Problem List   Diagnosis Date Noted  . Hypogonadism in male 03/14/2016  . Nocturia 03/14/2016    History reviewed. No pertinent surgical history.  Prior to Admission medications   Medication Sig Start Date End Date Taking? Authorizing Provider  Cinnamon 500 MG capsule Take 500 mg by mouth daily.    [provider]  Multiple Vitamin (MULTIVITAMIN) tablet Take 1 tablet by mouth daily.    [provider]  Omega-3 Fatty Acids (FISH OIL) 1000 MG CAPS Take by mouth.    [provider]  phentermine 37.5 MG capsule Take 37.5 mg by mouth every morning.    [provider]    Allergies Patient has no known allergies.  Family History  Problem Relation Age of Onset  . Kidney cancer Neg Hx   . Kidney disease Neg Hx   . Prostate cancer Neg Hx     Social History Social History   Tobacco Use  . Smoking status: Never Smoker  . Smokeless tobacco: Never Used  Substance Use Topics  . Alcohol use: No  . Drug use: Never    Review of Systems Constitutional: No fever/chills.  Positive for malaise. Eyes: No visual changes. ENT: No sore  throat. Cardiovascular: Denies chest pain. Respiratory: Denies shortness of breath.  Negative for cough. Gastrointestinal: No abdominal pain.  No nausea, no vomiting.  Positive diarrhea.  No constipation. Genitourinary: Negative for dysuria. Musculoskeletal: Negative for muscle aches. Skin: Negative for rash. Neurological: Negative for headaches, focal weakness or numbness. ____________________________________________   PHYSICAL EXAM:  VITAL SIGNS: ED Triage Vitals [08/03/19 1149]  Enc Vitals Group     BP (!) 169/88     Pulse Rate 71     Resp 16     Temp 99.1 F (37.3 C)     Temp Source Oral     SpO2 96 %     Weight 265 lb (120.2 kg)     Height 5\' 9"  (1.753 m)     Head Circumference      Peak Flow      Pain Score 0     Pain Loc      Pain Edu?      Excl. in GC?    Constitutional: Alert and oriented. Well appearing and in no acute distress. Eyes: Conjunctivae are normal.  Head: Atraumatic. Neck: No stridor.   Cardiovascular: Normal rate, regular rhythm. Grossly normal heart sounds.  Good peripheral circulation. Respiratory: Normal respiratory effort.  No retractions. Lungs CTAB. Gastrointestinal: Soft and nontender. No distention.  Musculoskeletal: Moves upper and lower extremities any difficulty.  Normal gait was noted. Neurologic:  Normal speech and language. No gross focal neurologic deficits are appreciated. No gait instability. Skin:  Skin is warm, dry and intact. No rash noted. Psychiatric: Mood and affect are normal. Speech and behavior are normal.  ____________________________________________   LABS (all labs ordered are listed, but only abnormal results are displayed)  Labs Reviewed  SARS CORONAVIRUS 2 (TAT 6-24 HRS)     PROCEDURES  Procedure(s) performed (including Critical Care):  Procedures   ____________________________________________   INITIAL IMPRESSION / ASSESSMENT AND PLAN / ED COURSE  As part of my medical decision making, I reviewed  the following data within the electronic MEDICAL RECORD NUMBER Notes from prior ED visits and [place] Controlled Substance Database  Kyle Miles was evaluated in Emergency Department on 08/03/2019 for the symptoms described in the history of present illness. He was evaluated in the context of the global COVID-19 pandemic, which necessitated consideration that the patient might be at risk for infection with the SARS-CoV-2 virus that causes COVID-19. Institutional protocols and algorithms that pertain to the evaluation of patients at risk for COVID-19 are in a state of rapid change based on information released by regulatory bodies including the CDC and federal and state organizations. These policies and algorithms were followed during the patient's care in the ED.  49 year old male presents to the ED with complaint of diarrhea 3 times a day for the last 2 days.  Patient denies any respiratory symptoms but states that he is "been feeling bad".  His wife is also here to be seen.  He states that she was the one exposed to someone positive with Covid.  He states that she is having some respiratory symptoms.  Patient was encouraged to increase fluids and take Tylenol as needed.  A Covid test was done and patient is aware that he needs to quarantine until he receives the results of his test.  He is also to quarantine if his wife's test is positive today.  ____________________________________________   FINAL CLINICAL IMPRESSION(S) / ED DIAGNOSES  Final diagnoses:  Diarrhea, unspecified type     ED Discharge Orders    None       Note:  This document was prepared using Dragon voice recognition software and may include unintentional dictation errors.    Johnn Hai, PA-C 08/03/19 1353    Johnn Hai, PA-C 08/03/19 1353    Merlyn Lot, MD 08/03/19 [phone]

## 2019-08-03 NOTE — ED Triage Notes (Signed)
Patient reports his wife was around someone covid + and hasnt been feeling well and now he reports he "feels bad" and is here for covid test. NAD noted. Unlabored breathing. C/o intermittent diarrhea X2 days

## 2019-08-03 NOTE — Discharge Instructions (Signed)
Follow-up with your primary care provider if any continued problems.  Return to the emergency department if any shortness of breath or difficulty breathing.  Be sure to drink plenty of fluids to stay hydrated.  You may take Tylenol if needed for fever, body aches or headache.  Your Covid test was done today and should result tomorrow.  You can get the results on my chart.  You and your wife will need to quarantine until you receive the results of your test.

## 2019-08-04 ENCOUNTER — Telehealth: Payer: Self-pay | Admitting: General Practice

## 2019-08-04 NOTE — Telephone Encounter (Signed)
Negative COVID results given. Patient results "NOT Detected." Caller expressed understanding. ° °

## 2020-03-21 DIAGNOSIS — U071 COVID-19: Secondary | ICD-10-CM | POA: Diagnosis not present

## 2020-03-21 DIAGNOSIS — Z20822 Contact with and (suspected) exposure to covid-19: Secondary | ICD-10-CM | POA: Diagnosis not present

## 2020-03-21 DIAGNOSIS — Z20828 Contact with and (suspected) exposure to other viral communicable diseases: Secondary | ICD-10-CM | POA: Diagnosis not present

## 2020-03-27 ENCOUNTER — Encounter: Payer: Self-pay | Admitting: Emergency Medicine

## 2020-03-27 ENCOUNTER — Other Ambulatory Visit: Payer: Self-pay

## 2020-03-27 DIAGNOSIS — U071 COVID-19: Secondary | ICD-10-CM | POA: Insufficient documentation

## 2020-03-27 DIAGNOSIS — Z5321 Procedure and treatment not carried out due to patient leaving prior to being seen by health care provider: Secondary | ICD-10-CM | POA: Insufficient documentation

## 2020-03-27 MED ORDER — IBUPROFEN 800 MG PO TABS
800.0000 mg | ORAL_TABLET | Freq: Once | ORAL | Status: AC
  Administered 2020-03-27: 800 mg via ORAL
  Filled 2020-03-27: qty 1

## 2020-03-27 NOTE — ED Triage Notes (Signed)
Pt arrives POV and ambulatory tor triage with c/o fever due to testing positive for the Covid+. Pt states that [place] [facility]  Told him to come to the ED if his temperature was above 102 and he checked today and it was 102.4. Pt is in NAD.

## 2020-03-28 ENCOUNTER — Emergency Department
Admission: EM | Admit: 2020-03-28 | Discharge: 2020-03-28 | Disposition: A | Discharge status: Home / Self Care | Payer: 59 | Attending: Emergency Medicine | Admitting: Emergency Medicine

## 2020-03-30 ENCOUNTER — Emergency Department: Payer: BC Managed Care – PPO

## 2020-03-30 ENCOUNTER — Encounter: Payer: Self-pay | Admitting: *Deleted

## 2020-03-30 ENCOUNTER — Other Ambulatory Visit: Payer: Self-pay

## 2020-03-30 DIAGNOSIS — R7989 Other specified abnormal findings of blood chemistry: Secondary | ICD-10-CM | POA: Diagnosis not present

## 2020-03-30 DIAGNOSIS — J1282 Pneumonia due to coronavirus disease 2019: Secondary | ICD-10-CM | POA: Diagnosis not present

## 2020-03-30 DIAGNOSIS — U071 COVID-19: Principal | ICD-10-CM | POA: Diagnosis present

## 2020-03-30 DIAGNOSIS — Z79899 Other long term (current) drug therapy: Secondary | ICD-10-CM | POA: Diagnosis not present

## 2020-03-30 DIAGNOSIS — R7301 Impaired fasting glucose: Secondary | ICD-10-CM | POA: Diagnosis present

## 2020-03-30 DIAGNOSIS — J9601 Acute respiratory failure with hypoxia: Secondary | ICD-10-CM | POA: Diagnosis present

## 2020-03-30 DIAGNOSIS — J189 Pneumonia, unspecified organism: Secondary | ICD-10-CM | POA: Diagnosis not present

## 2020-03-30 LAB — BASIC METABOLIC PANEL
Anion gap: 11 (ref 5–15)
BUN: 17 mg/dL (ref 6–20)
CO2: 28 mmol/L (ref 22–32)
Calcium: 8.4 mg/dL — ABNORMAL LOW (ref 8.9–10.3)
Chloride: 98 mmol/L (ref 98–111)
Creatinine, Ser: 1.21 mg/dL (ref 0.61–1.24)
GFR calc Af Amer: >60 mL/min (ref >60)
GFR calc non Af Amer: >60 mL/min (ref >60)
Glucose, Bld: 111 mg/dL — ABNORMAL HIGH (ref 70–99)
Potassium: 4.1 mmol/L (ref 3.5–5.1)
Sodium: 137 mmol/L (ref 135–145)

## 2020-03-30 LAB — CBC
HCT: 41.1 % (ref 39.0–52.0)
Hemoglobin: 14.2 g/dL (ref 13.0–17.0)
MCH: 28.6 pg (ref 26.0–34.0)
MCHC: 34.5 g/dL (ref 30.0–36.0)
MCV: 82.7 fL (ref 80.0–100.0)
Platelets: 336 10*3/uL (ref 150–400)
RBC: 4.97 MIL/uL (ref 4.22–5.81)
RDW: 12.6 % (ref 11.5–15.5)
WBC: 5.5 10*3/uL (ref 4.0–10.5)
nRBC: 0 % (ref 0.0–0.2)

## 2020-03-30 LAB — TROPONIN I (HIGH SENSITIVITY): Troponin I (High Sensitivity): 11 ng/L (ref <18)

## 2020-03-30 NOTE — ED Triage Notes (Signed)
Pt brought in via ems from home.  Pt is positive Covid.  Pt has sob and chest pain.  Pt seen here recently for similar sx.  Pt alert.

## 2020-03-31 ENCOUNTER — Inpatient Hospital Stay
Admission: EM | Admit: 2020-03-31 | Discharge: 2020-04-02 | DRG: 177 | Disposition: A | Discharge status: Home / Self Care | Payer: BC Managed Care – PPO | Attending: Internal Medicine | Admitting: Internal Medicine

## 2020-03-31 DIAGNOSIS — J1282 Pneumonia due to coronavirus disease 2019: Secondary | ICD-10-CM | POA: Diagnosis present

## 2020-03-31 DIAGNOSIS — U071 COVID-19: Secondary | ICD-10-CM | POA: Diagnosis present

## 2020-03-31 DIAGNOSIS — R7301 Impaired fasting glucose: Secondary | ICD-10-CM | POA: Diagnosis present

## 2020-03-31 DIAGNOSIS — J9601 Acute respiratory failure with hypoxia: Secondary | ICD-10-CM | POA: Diagnosis present

## 2020-03-31 DIAGNOSIS — Z79899 Other long term (current) drug therapy: Secondary | ICD-10-CM | POA: Diagnosis not present

## 2020-03-31 DIAGNOSIS — R7303 Prediabetes: Secondary | ICD-10-CM

## 2020-03-31 DIAGNOSIS — R7989 Other specified abnormal findings of blood chemistry: Secondary | ICD-10-CM | POA: Diagnosis not present

## 2020-03-31 LAB — HIV ANTIBODY (ROUTINE TESTING W REFLEX): HIV Screen 4th Generation wRfx: NONREACTIVE

## 2020-03-31 LAB — TROPONIN I (HIGH SENSITIVITY): Troponin I (High Sensitivity): 14 ng/L (ref <18)

## 2020-03-31 MED ORDER — ENOXAPARIN SODIUM 40 MG/0.4ML ~~LOC~~ SOLN
40.0000 mg | SUBCUTANEOUS | Status: DC
  Administered 2020-03-31 – 2020-04-01 (×2): 40 mg via SUBCUTANEOUS
  Filled 2020-03-31 (×2): qty 0.4

## 2020-03-31 MED ORDER — SODIUM CHLORIDE 0.9 % IV SOLN: 250.0000 mL | INTRAVENOUS | Status: DC | PRN

## 2020-03-31 MED ORDER — SODIUM CHLORIDE 0.9 % IV SOLN: 200.0000 mg | Freq: Once | INTRAVENOUS | Status: DC

## 2020-03-31 MED ORDER — GUAIFENESIN-DM 100-10 MG/5ML PO SYRP
10.0000 mL | ORAL_SOLUTION | ORAL | Status: DC | PRN
  Administered 2020-04-02: 10 mL via ORAL
  Filled 2020-03-31 (×2): qty 10

## 2020-03-31 MED ORDER — ACETAMINOPHEN 325 MG PO TABS: 650.0000 mg | ORAL_TABLET | Freq: Four times a day (QID) | ORAL | Status: DC | PRN

## 2020-03-31 MED ORDER — SODIUM CHLORIDE 0.9% FLUSH: 3.0000 mL | INTRAVENOUS | Status: DC | PRN

## 2020-03-31 MED ORDER — SODIUM CHLORIDE 0.9 % IV SOLN
100.0000 mg | Freq: Every day | INTRAVENOUS | Status: DC
  Administered 2020-04-01 – 2020-04-02 (×2): 100 mg via INTRAVENOUS
  Filled 2020-03-31 (×4): qty 20

## 2020-03-31 MED ORDER — ONDANSETRON HCL 4 MG PO TABS: 4.0000 mg | ORAL_TABLET | Freq: Four times a day (QID) | ORAL | Status: DC | PRN

## 2020-03-31 MED ORDER — DEXAMETHASONE SODIUM PHOSPHATE 10 MG/ML IJ SOLN
6.0000 mg | INTRAMUSCULAR | Status: DC
  Administered 2020-04-01: 6 mg via INTRAVENOUS
  Filled 2020-03-31: qty 1

## 2020-03-31 MED ORDER — SODIUM CHLORIDE 0.9 % IV BOLUS
1000.0000 mL | Freq: Once | INTRAVENOUS | Status: AC
  Administered 2020-03-31: 1000 mL via INTRAVENOUS

## 2020-03-31 MED ORDER — SODIUM CHLORIDE 0.9% FLUSH
3.0000 mL | Freq: Two times a day (BID) | INTRAVENOUS | Status: DC
  Administered 2020-03-31 – 2020-04-02 (×5): 3 mL via INTRAVENOUS

## 2020-03-31 MED ORDER — SODIUM CHLORIDE 0.9 % IV SOLN
500.0000 mg | Freq: Once | INTRAVENOUS | Status: AC
  Administered 2020-03-31: 500 mg via INTRAVENOUS
  Filled 2020-03-31: qty 500

## 2020-03-31 MED ORDER — DEXAMETHASONE SODIUM PHOSPHATE 10 MG/ML IJ SOLN
10.0000 mg | Freq: Once | INTRAMUSCULAR | Status: AC
  Administered 2020-03-31: 10 mg via INTRAVENOUS
  Filled 2020-03-31: qty 1

## 2020-03-31 MED ORDER — ASCORBIC ACID 500 MG PO TABS
500.0000 mg | ORAL_TABLET | Freq: Every day | ORAL | Status: DC
  Administered 2020-03-31 – 2020-04-02 (×3): 500 mg via ORAL
  Filled 2020-03-31 (×4): qty 1

## 2020-03-31 MED ORDER — ONDANSETRON HCL 4 MG/2ML IJ SOLN: 4.0000 mg | Freq: Four times a day (QID) | INTRAMUSCULAR | Status: DC | PRN

## 2020-03-31 MED ORDER — SODIUM CHLORIDE 0.9 % IV SOLN: 100.0000 mg | Freq: Every day | INTRAVENOUS | Status: DC

## 2020-03-31 MED ORDER — SODIUM CHLORIDE 0.9 % IV SOLN
1.0000 g | Freq: Once | INTRAVENOUS | Status: AC
  Administered 2020-03-31: 1 g via INTRAVENOUS
  Filled 2020-03-31: qty 10

## 2020-03-31 MED ORDER — ZINC SULFATE 220 (50 ZN) MG PO CAPS
220.0000 mg | ORAL_CAPSULE | Freq: Every day | ORAL | Status: DC
  Administered 2020-03-31 – 2020-04-02 (×3): 220 mg via ORAL
  Filled 2020-03-31 (×4): qty 1

## 2020-03-31 MED ORDER — ALBUTEROL SULFATE HFA 108 (90 BASE) MCG/ACT IN AERS
2.0000 | INHALATION_SPRAY | Freq: Four times a day (QID) | RESPIRATORY_TRACT | Status: DC
  Administered 2020-03-31 – 2020-04-02 (×9): 2 via RESPIRATORY_TRACT
  Filled 2020-03-31: qty 6.7

## 2020-03-31 MED ORDER — SODIUM CHLORIDE 0.9 % IV SOLN
200.0000 mg | Freq: Once | INTRAVENOUS | Status: AC
  Administered 2020-03-31: 200 mg via INTRAVENOUS
  Filled 2020-03-31: qty 40

## 2020-03-31 NOTE — ED Notes (Signed)
Lunch provided.

## 2020-03-31 NOTE — ED Notes (Signed)
Labs drawn patient oob to bathroom in room.

## 2020-03-31 NOTE — H&P (Signed)
History and Physical    Kyle Miles BMW:[id number] DOB: 01/19/1971 DOA: 03/31/2020  PCP: Patient, No Pcp Per   Patient coming from: Home  I have personally briefly reviewed patient's old medical records in [facility]  Chief Complaint:   HPI: Kyle Miles is a 49 y.o. male with medical history significant for hypogonadism, recent diagnosis of COVID-19 infection about 10 days ago who presents to the ER for evaluation of worsening shortness of breath, chest pain, nausea, diarrhea as well as poor oral intake. Patient tested positive for the COVID-19 virus on 03/21/20 and was exposed to someone with Covid infection. He denies having any urinary symptoms, no headache, no palpitations or diaphoresis Labs show sodium 137, potassium 4.1, chloride 98, bicarb 28, glucose 111, BUN 17, creatinine 1.21, calcium 8.4, white count 5.5, hemoglobin 14.2, hematocrit 41.1, MCV 82.7, RDW 12.6, platelet count 336 Chest x-ray reviewed by me shows patchy peripheral bibasilar airspace infiltrates Twelve-lead EKG reviewed by me shows normal sinus rhythm   ED Course: Patient is a 49 year old male recently diagnosed with COVID-19 pneumonia who presents to the ER for evaluation of worsening shortness of breath, nausea, vomiting, diarrhea and poor oral intake.  Chest x-ray is consistent with Covid pneumonia.  Patient had room air pulse oximetry of 89% and will be admitted to the hospital for further evaluation.  Review of Systems: As per HPI otherwise 10 point review of systems negative.    No past medical history on file.  No past surgical history on file.   reports that he has never smoked. He has never used smokeless tobacco. He reports that he does not drink alcohol and does not use drugs.  No Known Allergies  Family History  Problem Relation Age of Onset  . Kidney cancer Neg Hx   . Kidney disease Neg Hx   . Prostate cancer Neg Hx      Prior to Admission medications   Medication Sig  Start Date End Date Taking? Authorizing Provider  Cinnamon 500 MG capsule Take 500 mg by mouth daily. Patient not taking: Reported on 03/31/2020    [provider]  Multiple Vitamin (MULTIVITAMIN) tablet Take 1 tablet by mouth daily. Patient not taking: Reported on 03/31/2020    [provider]  Omega-3 Fatty Acids (FISH OIL) 1000 MG CAPS Take by mouth. Patient not taking: Reported on 03/31/2020    [provider]  phentermine (ADIPEX-P) 37.5 MG tablet Take 37.5 mg by mouth daily. Patient not taking: Reported on 03/31/2020 03/22/20   [provider]    Physical Exam: Vitals:   03/31/20 0830 03/31/20 0900 03/31/20 0930 03/31/20 1000  BP: (!) 147/84 (!) 130/92 132/83 105/63  Pulse: 72 75 72 70  Resp: (!) 26 (!) 28 (!) 26 (!) 21  Temp:      TempSrc:      SpO2: 97% 99% 96% 97%  Weight:      Height:         Vitals:   03/31/20 0830 03/31/20 0900 03/31/20 0930 03/31/20 1000  BP: (!) 147/84 (!) 130/92 132/83 105/63  Pulse: 72 75 72 70  Resp: (!) 26 (!) 28 (!) 26 (!) 21  Temp:      TempSrc:      SpO2: 97% 99% 96% 97%  Weight:      Height:        Constitutional: NAD, alert and oriented x 3 Eyes: PERRL, lids and conjunctivae normal ENMT: Mucous membranes are moist.  Neck: normal,  supple, no masses, no thyromegaly Respiratory: BLAE, no wheezing, no crackles. Normal respiratory effort. No accessory muscle use.  Cardiovascular: Regular rate and rhythm, no murmurs / rubs / gallops. No extremity edema. 2+ pedal pulses. No carotid bruits.  Abdomen: no tenderness, no masses palpated. No hepatosplenomegaly. Bowel sounds positive.  Musculoskeletal: no clubbing / cyanosis. No joint deformity upper and lower extremities.  Skin: no rashes, lesions, ulcers.  Neurologic: No gross focal neurologic deficit.  Generalized weakness Psychiatric: Normal mood and affect.   Labs on Admission: I have personally reviewed following labs and imaging studies  CBC: Recent  Labs  Lab 03/30/20 2138  WBC 5.5  HGB 14.2  HCT 41.1  MCV 82.7  PLT 336   Basic Metabolic Panel: Recent Labs  Lab 03/30/20 2138  NA 137  K 4.1  CL 98  CO2 28  GLUCOSE 111*  BUN 17  CREATININE 1.21  CALCIUM 8.4*   GFR: Estimated Creatinine Clearance: 89.7 mL/min (by C-G formula based on SCr of 1.21 mg/dL). Liver Function Tests: No results for input(s): AST, ALT, ALKPHOS, BILITOT, PROT, ALBUMIN in the last 168 hours. No results for input(s): LIPASE, AMYLASE in the last 168 hours. No results for input(s): AMMONIA in the last 168 hours. Coagulation Profile: No results for input(s): INR, PROTIME in the last 168 hours. Cardiac Enzymes: No results for input(s): CKTOTAL, CKMB, CKMBINDEX, TROPONINI in the last 168 hours. BNP (last 3 results) No results for input(s): PROBNP in the last 8760 hours. HbA1C: No results for input(s): HGBA1C in the last 72 hours. CBG: No results for input(s): GLUCAP in the last 168 hours. Lipid Profile: No results for input(s): CHOL, HDL, LDLCALC, TRIG, CHOLHDL, LDLDIRECT in the last 72 hours. Thyroid Function Tests: No results for input(s): TSH, T4TOTAL, FREET4, T3FREE, THYROIDAB in the last 72 hours. Anemia Panel: No results for input(s): VITAMINB12, FOLATE, FERRITIN, TIBC, IRON, RETICCTPCT in the last 72 hours. Urine analysis: No results found for: COLORURINE, APPEARANCEUR, LABSPEC, PHURINE, GLUCOSEU, HGBUR, BILIRUBINUR, KETONESUR, PROTEINUR, UROBILINOGEN, NITRITE, LEUKOCYTESUR  Radiological Exams on Admission: DG Chest 2 View  Result Date: 03/30/2020 CLINICAL DATA:  Dyspnea, COVID pneumonia EXAM: CHEST - 2 VIEW COMPARISON:  None. FINDINGS: Lung volumes are small, but are symmetric. Superimposed patchy bilateral airspace infiltrates are seen peripherally at the lung bases bilaterally, likely infectious or inflammatory in the acute setting. No pneumothorax or pleural effusion. Cardiac size within normal limits. Pulmonary vascularity is normal. No  acute bone abnormality. IMPRESSION: Patchy peripheral bibasilar airspace infiltrates, likely infectious or inflammatory in the acute setting. Electronically Signed   By: Helyn Numbers MD   On: 03/30/2020 22:07    EKG: Independently reviewed.  Sinus rhythm  Assessment/Plan Active Problems:   Acute hypoxemic respiratory failure due to COVID-19 [facility])   Pneumonia due to COVID-19 virus    Pneumonia due to COVID-19 virus with acute respiratory failure Patient presents to the emergency room for evaluation of worsening shortness of breath, nausea, vomiting and diarrhea His COVID-19 PCR test was positive on 03/21/20 Continue remdesivir per protocol Continue Decadron Patient had room air pulse oximetry of 89% and improved with oxygen supplementation at 2 L to 93% Continue oxygen supplementation to maintain pulse oximetry greater than 92%    DVT prophylaxis: Lovenox Code Status: Full Family Communication: Greater than 50% of time was spent discussing plan of care with patient at the bedside.  All questions and concerns have been addressed.  He verbalizes understanding and agrees to the plan. Disposition Plan: Back to previous  home environment Consults called: None    Alyssah Algeo MD [facility]     03/31/2020, 10:59 AM

## 2020-03-31 NOTE — Progress Notes (Signed)
Remdesivir - Pharmacy Brief Note   A/P:  Remdesivir 200 mg IVPB once followed by 100 mg IVPB daily x 4 days.   Valrie Hart, PharmD Clinical Pharmacist  03/31/2020 5:42 AM

## 2020-03-31 NOTE — ED Notes (Signed)
Pt resting quietly on stretcher with eyes closed. Nasal canula in place. Pt reports O2 is helping improve his breathing. No acute distress noted at this time.

## 2020-03-31 NOTE — ED Provider Notes (Signed)
[facility] Emergency Department Provider Note  ____________________________________________   First MD Initiated Contact with Patient 03/31/20 [phone]     (approximate)  I have reviewed the triage vital signs and the nursing notes.   HISTORY  Chief Complaint Covid Exposure and Shortness of Breath    HPI Kyle Miles is a 49 y.o. male with below list of previous medical conditions including recently diagnosed COVID-19 infection 10 days ago presents to the emergency department secondary to progressive dyspnea chest pain, fever nausea diarrhea loss of taste and smell.  On my arrival to the room patient's oxygen saturation 89% on room air.        No past medical history on file.  Patient Active Problem List   Diagnosis Date Noted  . Hypogonadism in male 03/14/2016  . Nocturia 03/14/2016    No past surgical history on file.  Prior to Admission medications   Medication Sig Start Date End Date Taking? Authorizing Provider  Cinnamon 500 MG capsule Take 500 mg by mouth daily.    [provider]  Multiple Vitamin (MULTIVITAMIN) tablet Take 1 tablet by mouth daily.    [provider]  Omega-3 Fatty Acids (FISH OIL) 1000 MG CAPS Take by mouth.    [provider]  phentermine 37.5 MG capsule Take 37.5 mg by mouth every morning.    [provider]    Allergies Patient has no known allergies.  Family History  Problem Relation Age of Onset  . Kidney cancer Neg Hx   . Kidney disease Neg Hx   . Prostate cancer Neg Hx     Social History Social History   Tobacco Use  . Smoking status: Never Smoker  . Smokeless tobacco: Never Used  Substance Use Topics  . Alcohol use: No  . Drug use: Never    Review of Systems Constitutional: Positive for fever/chills Eyes: No visual changes. ENT: Positive for loss of taste and smell Cardiovascular: Denies chest pain. Respiratory: Positive for cough and shortness of  breath. Gastrointestinal: No abdominal pain.  No nausea, no vomiting.  Positive for diarrhea.  No constipation. Genitourinary: Negative for dysuria. Musculoskeletal: Negative for neck pain.  Negative for back pain. Integumentary: Negative for rash. Neurological: Negative for headaches, focal weakness or numbness.   ____________________________________________   PHYSICAL EXAM:  VITAL SIGNS: ED Triage Vitals  Enc Vitals Group     BP 03/30/20 2133 (!) 146/89     Pulse Rate 03/30/20 2133 89     Resp 03/30/20 2133 20     Temp 03/30/20 2133 (!) 100.5 F (38.1 C)     Temp Source 03/30/20 2133 Oral     SpO2 03/30/20 2133 94 %     Weight 03/30/20 2136 113.4 kg (250 lb)     Height 03/30/20 2136 1.753 m (5\' 9" )     Head Circumference --      Peak Flow --      Pain Score 03/30/20 2135 6     Pain Loc --      Pain Edu? --      Excl. in GC? --     Constitutional: Alert and oriented.  Eyes: Conjunctivae are normal.  Head: Atraumatic. Mouth/Throat: Dry oral mucosa Neck: No stridor.  No meningeal signs.   Cardiovascular: Normal rate, regular rhythm. Good peripheral circulation. Grossly normal heart sounds. Respiratory: Normal respiratory effort.  No retractions. Gastrointestinal: Soft and nontender. No distention.  Musculoskeletal: No lower extremity tenderness nor edema. No gross deformities  of extremities. Neurologic:  Normal speech and language. No gross focal neurologic deficits are appreciated.  Skin:  Skin is warm, dry and intact. Psychiatric: Mood and affect are normal. Speech and behavior are normal.  ____________________________________________   LABS (all labs ordered are listed, but only abnormal results are displayed)  Labs Reviewed  BASIC METABOLIC PANEL - Abnormal; Notable for the following components:      Result Value   Glucose, Bld 111 (*)    Calcium 8.4 (*)    All other components within normal limits  CBC  TROPONIN I (HIGH SENSITIVITY)  TROPONIN I (HIGH  SENSITIVITY)   ____________________________________________  EKG  ED ECG REPORT I, Stoy N Jordy Verba, the attending physician, personally viewed and interpreted this ECG.   Date: 03/30/2020  EKG Time: 9:40 PM  Rate: 84  Rhythm: Normal sinus rhythm  Axis: Normal  Intervals: Normal  ST&T Change: None  ____________________________________________  RADIOLOGY I, Huron N Jaki Hammerschmidt, personally viewed and evaluated these images (plain radiographs) as part of my medical decision making, as well as reviewing the written report by the radiologist.  ED MD interpretation: Patchy peripheral bibasilar airspace infiltrates on chest x-ray per radiologist.  Official radiology report(s): DG Chest 2 View  Result Date: 03/30/2020 CLINICAL DATA:  Dyspnea, COVID pneumonia EXAM: CHEST - 2 VIEW COMPARISON:  None. FINDINGS: Lung volumes are small, but are symmetric. Superimposed patchy bilateral airspace infiltrates are seen peripherally at the lung bases bilaterally, likely infectious or inflammatory in the acute setting. No pneumothorax or pleural effusion. Cardiac size within normal limits. Pulmonary vascularity is normal. No acute bone abnormality. IMPRESSION: Patchy peripheral bibasilar airspace infiltrates, likely infectious or inflammatory in the acute setting. Electronically Signed   By: Helyn Numbers MD   On: 03/30/2020 22:07    ____________________________________________   Procedures   ____________________________________________   INITIAL IMPRESSION / MDM / ASSESSMENT AND PLAN / ED COURSE  As part of my medical decision making, I reviewed the following data within the electronic MEDICAL RECORD NUMBER  49 year old male presented with above-stated history and physical exam consistent with Covid pneumonia with hypoxia which was confirmed on chest x-ray.  3 L of oxygen via nasal cannula applied.  Patient given Decadron 10 mg IV remdesivir ordered IV ceftriaxone and azithromycin given.  Patient  given 2 L IV normal saline secondary to dehydration.  Patient discussed with Dr. Arville Care hospitalist for admission for further evaluation and management.  ____________________________________________  FINAL CLINICAL IMPRESSION(S) / ED DIAGNOSES  Final diagnoses:  Pneumonia due to COVID-19 virus     MEDICATIONS GIVEN DURING THIS VISIT:  Medications  dexamethasone (DECADRON) injection 10 mg (has no administration in time range)  cefTRIAXone (ROCEPHIN) 1 g in sodium chloride 0.9 % 100 mL IVPB (has no administration in time range)  azithromycin (ZITHROMAX) 500 mg in sodium chloride 0.9 % 250 mL IVPB (has no administration in time range)  sodium chloride 0.9 % bolus 1,000 mL (has no administration in time range)  sodium chloride 0.9 % bolus 1,000 mL (has no administration in time range)  remdesivir 200 mg in sodium chloride 0.9% 250 mL IVPB (has no administration in time range)    Followed by  remdesivir 100 mg in sodium chloride 0.9 % 100 mL IVPB (has no administration in time range)     ED Discharge Orders    None      *Please note:  Kyle Miles was evaluated in Emergency Department on 03/31/2020 for the symptoms described in the history of  present illness. He was evaluated in the context of the global COVID-19 pandemic, which necessitated consideration that the patient might be at risk for infection with the SARS-CoV-2 virus that causes COVID-19. Institutional protocols and algorithms that pertain to the evaluation of patients at risk for COVID-19 are in a state of rapid change based on information released by regulatory bodies including the CDC and federal and state organizations. These policies and algorithms were followed during the patient's care in the ED.  Some ED evaluations and interventions may be delayed as a result of limited staffing during and after the pandemic.*  Note:  This document was prepared using Dragon voice recognition software and may include unintentional  dictation errors.   Darci Current, MD 03/31/20 [phone]

## 2020-03-31 NOTE — ED Notes (Signed)
Pt reports his breathing has improved. No distress noted at this time.

## 2020-03-31 NOTE — ED Notes (Signed)
Pt given [place] sandwich tray and coke at this time per request. Pt comfortable in bed, no further needs noted. Will continue to monitor.

## 2020-04-01 ENCOUNTER — Encounter: Payer: Self-pay | Admitting: Internal Medicine

## 2020-04-01 DIAGNOSIS — J1282 Pneumonia due to coronavirus disease 2019: Secondary | ICD-10-CM

## 2020-04-01 DIAGNOSIS — J9601 Acute respiratory failure with hypoxia: Secondary | ICD-10-CM

## 2020-04-01 DIAGNOSIS — R7303 Prediabetes: Secondary | ICD-10-CM

## 2020-04-01 DIAGNOSIS — R7301 Impaired fasting glucose: Secondary | ICD-10-CM

## 2020-04-01 DIAGNOSIS — U071 COVID-19: Principal | ICD-10-CM

## 2020-04-01 LAB — COMPREHENSIVE METABOLIC PANEL
ALT: 112 U/L — ABNORMAL HIGH (ref 0–44)
AST: 69 U/L — ABNORMAL HIGH (ref 15–41)
Albumin: 2.9 g/dL — ABNORMAL LOW (ref 3.5–5.0)
Alkaline Phosphatase: 64 U/L (ref 38–126)
Anion gap: 9 (ref 5–15)
BUN: 17 mg/dL (ref 6–20)
CO2: 26 mmol/L (ref 22–32)
Calcium: 8.1 mg/dL — ABNORMAL LOW (ref 8.9–10.3)
Chloride: 102 mmol/L (ref 98–111)
Creatinine, Ser: 1 mg/dL (ref 0.61–1.24)
GFR calc Af Amer: >60 mL/min (ref >60)
GFR calc non Af Amer: >60 mL/min (ref >60)
Glucose, Bld: 156 mg/dL — ABNORMAL HIGH (ref 70–99)
Potassium: 4.3 mmol/L (ref 3.5–5.1)
Sodium: 137 mmol/L (ref 135–145)
Total Bilirubin: 0.6 mg/dL (ref 0.3–1.2)
Total Protein: 6.6 g/dL (ref 6.5–8.1)

## 2020-04-01 LAB — CBC WITH DIFFERENTIAL/PLATELET
Abs Immature Granulocytes: 0.02 10*3/uL (ref 0.00–0.07)
Basophils Absolute: 0 10*3/uL (ref 0.0–0.1)
Basophils Relative: 0 %
Eosinophils Absolute: 0 10*3/uL (ref 0.0–0.5)
Eosinophils Relative: 0 %
HCT: 36.6 % — ABNORMAL LOW (ref 39.0–52.0)
Hemoglobin: 12.2 g/dL — ABNORMAL LOW (ref 13.0–17.0)
Immature Granulocytes: 1 %
Lymphocytes Relative: 15 %
Lymphs Abs: 0.6 10*3/uL — ABNORMAL LOW (ref 0.7–4.0)
MCH: 28.2 pg (ref 26.0–34.0)
MCHC: 33.3 g/dL (ref 30.0–36.0)
MCV: 84.5 fL (ref 80.0–100.0)
Monocytes Absolute: 0.3 10*3/uL (ref 0.1–1.0)
Monocytes Relative: 8 %
Neutro Abs: 3 10*3/uL (ref 1.7–7.7)
Neutrophils Relative %: 76 %
Platelets: 358 10*3/uL (ref 150–400)
RBC: 4.33 MIL/uL (ref 4.22–5.81)
RDW: 12.5 % (ref 11.5–15.5)
WBC: 3.9 10*3/uL — ABNORMAL LOW (ref 4.0–10.5)
nRBC: 0 % (ref 0.0–0.2)

## 2020-04-01 LAB — FIBRIN DERIVATIVES D-DIMER (ARMC ONLY): Fibrin derivatives D-dimer (ARMC): 632.78 ng/mL (FEU) — ABNORMAL HIGH (ref 0.00–499.00)

## 2020-04-01 LAB — GLUCOSE, CAPILLARY
Glucose-Capillary: 155 mg/dL — ABNORMAL HIGH (ref 70–99)
Glucose-Capillary: 162 mg/dL — ABNORMAL HIGH (ref 70–99)
Glucose-Capillary: 145 mg/dL — ABNORMAL HIGH (ref 70–99)

## 2020-04-01 LAB — HEMOGLOBIN A1C
Hgb A1c MFr Bld: 6.2 % — ABNORMAL HIGH (ref 4.8–5.6)
Mean Plasma Glucose: 131.24 mg/dL

## 2020-04-01 LAB — C-REACTIVE PROTEIN: CRP: 3.8 mg/dL — ABNORMAL HIGH (ref <1.0)

## 2020-04-01 LAB — FERRITIN: Ferritin: 302 ng/mL (ref 24–336)

## 2020-04-01 LAB — PHOSPHORUS: Phosphorus: 3.5 mg/dL (ref 2.5–4.6)

## 2020-04-01 LAB — MAGNESIUM: Magnesium: 2.4 mg/dL (ref 1.7–2.4)

## 2020-04-01 MED ORDER — INSULIN ASPART 100 UNIT/ML ~~LOC~~ SOLN
0.0000 [IU] | Freq: Three times a day (TID) | SUBCUTANEOUS | Status: DC
  Administered 2020-04-01 – 2020-04-02 (×2): 3 [IU] via SUBCUTANEOUS
  Administered 2020-04-02: 09:00:00 2 [IU] via SUBCUTANEOUS
  Filled 2020-04-01 (×3): qty 1

## 2020-04-01 MED ORDER — METHYLPREDNISOLONE SODIUM SUCC 125 MG IJ SOLR
100.0000 mg | Freq: Two times a day (BID) | INTRAMUSCULAR | Status: DC
  Administered 2020-04-01 – 2020-04-02 (×2): 100 mg via INTRAVENOUS
  Filled 2020-04-01 (×2): qty 2

## 2020-04-01 MED ORDER — INSULIN ASPART 100 UNIT/ML ~~LOC~~ SOLN: 0.0000 [IU] | Freq: Every day | SUBCUTANEOUS | Status: DC

## 2020-04-01 NOTE — Progress Notes (Signed)
SATURATION QUALIFICATIONS: (This note is used to comply with regulatory documentation for home oxygen)  Patient Saturations on Room Air at Rest = 95%  Patient Saturations on Room Air while Ambulating = 81%  Patient Saturations on 4 Liters of oxygen while Ambulating =96%  Please briefly explain why patient needs home oxygen:

## 2020-04-01 NOTE — Progress Notes (Signed)
Patient ID: Kyle Miles, male   DOB: 1970/12/13, 49 y.o.   MRN: [id number] [facility] PROGRESS NOTE  REFUGIO VANDEVOORDE OVF:[id number] DOB: 11/26/1970 DOA: 03/31/2020 PCP: Patient, No Pcp Per  HPI/Subjective: Patient still with some shortness of breath and some cough.  Was diagnosed on 03/21/2020 with Covid as outpatient at the [facility].  Was hypoxic yesterday in the emergency room.  When I ambulated him in the room he dropped his pulse ox off oxygen into the 80s.  The nurse also had him ambulating and dropped his pulse ox down to 81.  Objective: Vitals:   04/01/20 0418 04/01/20 1127  BP: 132/88 129/84  Pulse: 67 72  Resp: (!) 22 18  Temp: 97.8 F (36.6 C) 97.8 F (36.6 C)  SpO2: 100% 94%    Intake/Output Summary (Last 24 hours) at 04/01/2020 1318 Last data filed at 04/01/2020 1100 Gross per 24 hour  Intake 360 ml  Output 550 ml  Net -190 ml   Filed Weights   03/30/20 2136  Weight: 113.4 kg    ROS: Review of Systems  Respiratory: Positive for cough and shortness of breath.   Cardiovascular: Negative for chest pain.  Gastrointestinal: Negative for abdominal pain, diarrhea, nausea and vomiting.   Exam: Physical Exam HENT:     Nose: No mucosal edema.     Mouth/Throat:     Pharynx: No oropharyngeal exudate.  Eyes:     General: Lids are normal.     Conjunctiva/sclera: Conjunctivae normal.     Pupils: Pupils are equal, round, and reactive to light.  Cardiovascular:     Rate and Rhythm: Normal rate and regular rhythm.     Heart sounds: Normal heart sounds, S1 normal and S2 normal.  Pulmonary:     Breath sounds: Examination of the right-middle field reveals decreased breath sounds. Examination of the left-middle field reveals decreased breath sounds. Examination of the right-lower field reveals decreased breath sounds and rhonchi. Examination of the left-lower field reveals decreased breath sounds and rhonchi. Decreased breath sounds and rhonchi present. No  wheezing or rales.  Abdominal:     Palpations: Abdomen is soft.     Tenderness: There is no abdominal tenderness.  Musculoskeletal:     Right lower leg: No swelling.     Left lower leg: No swelling.  Skin:    General: Skin is warm.     Findings: No rash.  Neurological:     Mental Status: He is alert and oriented to person, place, and time.       Data Reviewed: Basic Metabolic Panel: Recent Labs  Lab 03/30/20 2138 04/01/20 0524  NA 137 137  K 4.1 4.3  CL 98 102  CO2 28 26  GLUCOSE 111* 156*  BUN 17 17  CREATININE 1.21 1.00  CALCIUM 8.4* 8.1*  MG  --  2.4  PHOS  --  3.5   Liver Function Tests: Recent Labs  Lab 04/01/20 0524  AST 69*  ALT 112*  ALKPHOS 64  BILITOT 0.6  PROT 6.6  ALBUMIN 2.9*   CBC: Recent Labs  Lab 03/30/20 2138 04/01/20 0524  WBC 5.5 3.9*  NEUTROABS  --  3.0  HGB 14.2 12.2*  HCT 41.1 36.6*  MCV 82.7 84.5  PLT 336 358    Studies: DG Chest 2 View  Result Date: 03/30/2020 CLINICAL DATA:  Dyspnea, COVID pneumonia EXAM: CHEST - 2 VIEW COMPARISON:  None. FINDINGS: Lung volumes are small, but are symmetric. Superimposed patchy bilateral airspace infiltrates  are seen peripherally at the lung bases bilaterally, likely infectious or inflammatory in the acute setting. No pneumothorax or pleural effusion. Cardiac size within normal limits. Pulmonary vascularity is normal. No acute bone abnormality. IMPRESSION: Patchy peripheral bibasilar airspace infiltrates, likely infectious or inflammatory in the acute setting. Electronically Signed   By: Helyn Numbers MD   On: 03/30/2020 22:07    Scheduled Meds: . albuterol  2 puff Inhalation Q6H  . vitamin C  500 mg Oral Daily  . dexamethasone (DECADRON) injection  6 mg Intravenous Q24H  . enoxaparin (LOVENOX) injection  40 mg Subcutaneous Q24H  . sodium chloride flush  3 mL Intravenous Q12H  . zinc sulfate  220 mg Oral Daily   Continuous Infusions: . sodium chloride    . remdesivir 100 mg in NS 100  mL 100 mg (04/01/20 1004)    Assessment/Plan:  1. Acute hypoxic respiratory failure secondary to COVID-19.  Continue oxygen supplementation.  Pulse ox good at rest off oxygen but when he ambulates drops his pulse ox into the 80s. 2. Multifocal COVID-19 pneumonia.  Continue remdesivir day 2 today.  Switch Decadron over to Solu-Medrol.  Continue albuterol inhaler.  Continue vitamin C and zinc. 3. Impaired fasting glucose check a hemoglobin A1c.  Sliding scale insulin while on steroids.      Code Status:     Code Status Orders  (From admission, onward)         Start     Ordered   03/31/20 1023  Full code  Continuous        03/31/20 1024        Code Status History    This patient has a current code status but no historical code status.   Advance Care Planning Activity     Family Communication: Spoke with wife on the phone Disposition Plan: Status is: Inpatient  Dispo: The patient is from: Home              Anticipated d/c is to: Home              Anticipated d/c date is: Depends on when we can get him off the oxygen.  If able to get off the oxygen then will potentially be able to finish up course of remdesivir as outpatient.  If unable to get off the oxygen will stay here for the 5 days.              Patient currently being treated for acute hypoxic respiratory failure secondary to COVID-19 pneumonia.  Time spent: 28 minutes  Latonyia [employer]

## 2020-04-02 DIAGNOSIS — R7989 Other specified abnormal findings of blood chemistry: Secondary | ICD-10-CM

## 2020-04-02 LAB — CBC WITH DIFFERENTIAL/PLATELET
Abs Immature Granulocytes: 0.05 10*3/uL (ref 0.00–0.07)
Basophils Absolute: 0 10*3/uL (ref 0.0–0.1)
Basophils Relative: 0 %
Eosinophils Absolute: 0 10*3/uL (ref 0.0–0.5)
Eosinophils Relative: 0 %
HCT: 36.8 % — ABNORMAL LOW (ref 39.0–52.0)
Hemoglobin: 12.9 g/dL — ABNORMAL LOW (ref 13.0–17.0)
Immature Granulocytes: 1 %
Lymphocytes Relative: 8 %
Lymphs Abs: 0.6 10*3/uL — ABNORMAL LOW (ref 0.7–4.0)
MCH: 28.8 pg (ref 26.0–34.0)
MCHC: 35.1 g/dL (ref 30.0–36.0)
MCV: 82.1 fL (ref 80.0–100.0)
Monocytes Absolute: 0.2 10*3/uL (ref 0.1–1.0)
Monocytes Relative: 2 %
Neutro Abs: 6.1 10*3/uL (ref 1.7–7.7)
Neutrophils Relative %: 89 %
Platelets: 429 10*3/uL — ABNORMAL HIGH (ref 150–400)
RBC: 4.48 MIL/uL (ref 4.22–5.81)
RDW: 12.4 % (ref 11.5–15.5)
WBC: 6.9 10*3/uL (ref 4.0–10.5)
nRBC: 0 % (ref 0.0–0.2)

## 2020-04-02 LAB — PHOSPHORUS: Phosphorus: 3.9 mg/dL (ref 2.5–4.6)

## 2020-04-02 LAB — COMPREHENSIVE METABOLIC PANEL
ALT: 104 U/L — ABNORMAL HIGH (ref 0–44)
AST: 42 U/L — ABNORMAL HIGH (ref 15–41)
Albumin: 3 g/dL — ABNORMAL LOW (ref 3.5–5.0)
Alkaline Phosphatase: 57 U/L (ref 38–126)
Anion gap: 9 (ref 5–15)
BUN: 20 mg/dL (ref 6–20)
CO2: 26 mmol/L (ref 22–32)
Calcium: 8.2 mg/dL — ABNORMAL LOW (ref 8.9–10.3)
Chloride: 104 mmol/L (ref 98–111)
Creatinine, Ser: 0.89 mg/dL (ref 0.61–1.24)
GFR calc Af Amer: >60 mL/min (ref >60)
GFR calc non Af Amer: >60 mL/min (ref >60)
Glucose, Bld: 185 mg/dL — ABNORMAL HIGH (ref 70–99)
Potassium: 4.6 mmol/L (ref 3.5–5.1)
Sodium: 139 mmol/L (ref 135–145)
Total Bilirubin: 0.6 mg/dL (ref 0.3–1.2)
Total Protein: 6.7 g/dL (ref 6.5–8.1)

## 2020-04-02 LAB — FIBRIN DERIVATIVES D-DIMER (ARMC ONLY): Fibrin derivatives D-dimer (ARMC): 504.38 ng/mL (FEU) — ABNORMAL HIGH (ref 0.00–499.00)

## 2020-04-02 LAB — MAGNESIUM: Magnesium: 2.4 mg/dL (ref 1.7–2.4)

## 2020-04-02 LAB — C-REACTIVE PROTEIN: CRP: 1.5 mg/dL — ABNORMAL HIGH (ref <1.0)

## 2020-04-02 LAB — GLUCOSE, CAPILLARY
Glucose-Capillary: 150 mg/dL — ABNORMAL HIGH (ref 70–99)
Glucose-Capillary: 184 mg/dL — ABNORMAL HIGH (ref 70–99)

## 2020-04-02 LAB — FERRITIN: Ferritin: 298 ng/mL (ref 24–336)

## 2020-04-02 MED ORDER — SODIUM CHLORIDE 0.9 % IV SOLN
100.0000 mg | Freq: Once | INTRAVENOUS | Status: DC
  Filled 2020-04-02: qty 20

## 2020-04-02 MED ORDER — SODIUM CHLORIDE 0.9 % IV SOLN: 100.0000 mg | Freq: Once | INTRAVENOUS | Status: DC

## 2020-04-02 MED ORDER — ASCORBIC ACID 500 MG PO TABS: 500.0000 mg | ORAL_TABLET | Freq: Every day | ORAL | 0 refills | Status: AC

## 2020-04-02 MED ORDER — ALBUTEROL SULFATE HFA 108 (90 BASE) MCG/ACT IN AERS
2.0000 | INHALATION_SPRAY | Freq: Once | RESPIRATORY_TRACT | Status: DC | PRN
  Filled 2020-04-02: qty 6.7

## 2020-04-02 MED ORDER — DIPHENHYDRAMINE HCL 50 MG/ML IJ SOLN: 50.0000 mg | Freq: Once | INTRAMUSCULAR | Status: DC | PRN

## 2020-04-02 MED ORDER — FAMOTIDINE IN NACL 20-0.9 MG/50ML-% IV SOLN: 20.0000 mg | Freq: Once | INTRAVENOUS | Status: DC | PRN

## 2020-04-02 MED ORDER — SODIUM CHLORIDE 0.9 % IV SOLN: INTRAVENOUS | Status: DC | PRN

## 2020-04-02 MED ORDER — PREDNISONE 50 MG PO TABS: 50.0000 mg | ORAL_TABLET | Freq: Every day | ORAL | 0 refills | Status: DC

## 2020-04-02 MED ORDER — EPINEPHRINE 0.3 MG/0.3ML IJ SOAJ
0.3000 mg | Freq: Once | INTRAMUSCULAR | Status: DC | PRN
  Filled 2020-04-02: qty 0.6

## 2020-04-02 MED ORDER — ALBUTEROL SULFATE HFA 108 (90 BASE) MCG/ACT IN AERS: 2.0000 | INHALATION_SPRAY | Freq: Four times a day (QID) | RESPIRATORY_TRACT | 0 refills | Status: AC

## 2020-04-02 MED ORDER — DIPHENHYDRAMINE HCL 50 MG/ML IJ SOLN
50.0000 mg | Freq: Once | INTRAMUSCULAR | Status: DC | PRN
  Filled 2020-04-02: qty 1

## 2020-04-02 MED ORDER — METHYLPREDNISOLONE SODIUM SUCC 125 MG IJ SOLR: 125.0000 mg | Freq: Once | INTRAMUSCULAR | Status: DC | PRN

## 2020-04-02 MED ORDER — ZINC SULFATE 220 (50 ZN) MG PO CAPS: 220.0000 mg | ORAL_CAPSULE | Freq: Every day | ORAL | 0 refills | Status: AC

## 2020-04-02 NOTE — Progress Notes (Signed)
Patient scheduled for outpatient Remdesivir infusions at 1pm on Saturday 9/18 and Sunday 9/19 at [facility]. Please inform the patient to park at [address] [place], [place], as staff will be escorting the patient through the east entrance of the hospital. Appointments take approximately 45 minutes.    There is a wave flag banner located near the entrance on [address]. Turn into this entrance and immediately turn left and park in 1 of the 5 designated Covid Infusion Parking spots. There is a phone number on the sign, please call and let the staff know what spot you are in and we will come out and get you. For questions call [phone].  Thanks.

## 2020-04-02 NOTE — Progress Notes (Signed)
Pt discharged home a/ no  resp distress. Sl d/c earlier instructions discussed with pt.meds diet activity and f/u. Pt to go  Sat / sun to [place] for  Completion of remdesivir. Verbalizes understanding  Wife here at this time to pick pt up.

## 2020-04-02 NOTE — Progress Notes (Signed)
Patient ID: Kyle Miles   [facility] [place] at [facility]        Kyle Miles was admitted to the Hospital on 03/31/2020 and Discharged  04/02/2020 and should be excused from work/school   for 10 days starting 03/31/2020 , may return to work/school without any restrictions.  Kyle Miles M.D on 04/02/2020,at 9:31 AM  [facility] [place] at [facility]

## 2020-04-02 NOTE — Progress Notes (Signed)
SATURATION QUALIFICATIONS: (This note is used to comply with regulatory documentation for home oxygen)  Patient Saturations on Room Air at Rest = 90%  Patient Saturations on Room Air while Ambulating = 94%  Patient Saturations on 0 Liters of oxygen while Ambulating = 0%  Please briefly explain why patient needs home oxygen:

## 2020-04-02 NOTE — Plan of Care (Signed)
  Problem: Education: Goal: Knowledge of risk factors and measures for prevention of condition will improve Outcome: Progressing   Problem: Coping: Goal: Psychosocial and spiritual needs will be supported Outcome: Progressing   Problem: Respiratory: Goal: Will maintain a patent airway Outcome: Progressing Goal: Complications related to the disease process, condition or treatment will be avoided or minimized Outcome: Progressing   Problem: Education: Goal: Knowledge of General Education information will improve Description: Including pain rating scale, medication(s)/side effects and non-pharmacologic comfort measures Outcome: Progressing   Problem: Clinical Measurements: Goal: Ability to maintain clinical measurements within normal limits will improve Outcome: Progressing Goal: Will remain free from infection Outcome: Progressing Goal: Diagnostic test results will improve Outcome: Progressing Goal: Respiratory complications will improve Outcome: Progressing Goal: Cardiovascular complication will be avoided Outcome: Progressing   Problem: Pain Managment: Goal: General experience of comfort will improve Outcome: Progressing   Problem: Safety: Goal: Ability to remain free from injury will improve Outcome: Progressing   Problem: Skin Integrity: Goal: Risk for impaired skin integrity will decrease Outcome: Progressing   Problem: Education: Goal: Knowledge of risk factors and measures for prevention of condition will improve Outcome: Progressing   Problem: Coping: Goal: Psychosocial and spiritual needs will be supported Outcome: Progressing   Problem: Respiratory: Goal: Will maintain a patent airway Outcome: Progressing   Problem: Respiratory: Goal: Complications related to the disease process, condition or treatment will be avoided or minimized Outcome: Progressing   Problem: Education: Goal: Knowledge of General Education information will improve Description:  Including pain rating scale, medication(s)/side effects and non-pharmacologic comfort measures Outcome: Progressing

## 2020-04-02 NOTE — Discharge Instructions (Signed)
10 Things You Can Do to Manage Your COVID-19 Symptoms at Home If you have possible or confirmed COVID-19: 1. Stay home from work and school. And stay away from other public places. If you must go out, avoid using any kind of public transportation, ridesharing, or taxis. 2. Monitor your symptoms carefully. If your symptoms get worse, call your healthcare provider immediately. 3. Get rest and stay hydrated. 4. If you have a medical appointment, call the healthcare provider ahead of time and tell them that you have or may have COVID-19. 5. For medical emergencies, call 911 and notify the dispatch personnel that you have or may have COVID-19. 6. Cover your cough and sneezes with a tissue or use the inside of your elbow. 7. Wash your hands often with soap and water for at least 20 seconds or clean your hands with an alcohol-based hand sanitizer that contains at least 60% alcohol. 8. As much as possible, stay in a specific room and away from other people in your home. Also, you should use a separate bathroom, if available. If you need to be around other people in or outside of the home, wear a mask. 9. Avoid sharing personal items with other people in your household, like dishes, towels, and bedding. 10. Clean all surfaces that are touched often, like counters, tabletops, and doorknobs. Use household cleaning sprays or wipes according to the label instructions. SouthAmericaFlowers.co.uk 01/15/2019 This information is not intended to replace advice given to you by your health care provider. Make sure you discuss any questions you have with your health care provider. Document Revised: 06/19/2019 Document Reviewed: 06/19/2019 Elsevier Patient Education  2020 Elsevier Inc.  COVID-19: How to Protect Yourself and Others Know how it spreads  There is currently no vaccine to prevent coronavirus disease 2019 (COVID-19).  The best way to prevent illness is to avoid being exposed to this virus.  The virus is  thought to spread mainly from person-to-person. ? Between people who are in close contact with one another (within about 6 feet). ? Through respiratory droplets produced when an infected person coughs, sneezes or talks. ? These droplets can land in the mouths or noses of people who are nearby or possibly be inhaled into the lungs. ? COVID-19 may be spread by people who are not showing symptoms. Everyone should Clean your hands often  Wash your hands often with soap and water for at least 20 seconds especially after you have been in a public place, or after blowing your nose, coughing, or sneezing.  If soap and water are not readily available, use a hand sanitizer that contains at least 60% alcohol. Cover all surfaces of your hands and rub them together until they feel dry.  Avoid touching your eyes, nose, and mouth with unwashed hands. Avoid close contact  Limit contact with others as much as possible.  Avoid close contact with people who are sick.  Put distance between yourself and other people. ? Remember that some people without symptoms may be able to spread virus. ? This is especially important for people who are at higher risk of getting very RetroStamps.it Cover your mouth and nose with a mask when around others  You could spread COVID-19 to others even if you do not feel sick.  Everyone should wear a mask in public settings and when around people not living in their household, especially when social distancing is difficult to maintain. ? Masks should not be placed on young children under age 46, anyone who  has trouble breathing, or is unconscious, incapacitated or otherwise unable to remove the mask without assistance.  The mask is meant to protect other people in case you are infected.  Do NOT use a facemask meant for a Research scientist (physical sciences).  Continue to keep about 6 feet between yourself and others. The  mask is not a substitute for social distancing. Cover coughs and sneezes  Always cover your mouth and nose with a tissue when you cough or sneeze or use the inside of your elbow.  Throw used tissues in the trash.  Immediately wash your hands with soap and water for at least 20 seconds. If soap and water are not readily available, clean your hands with a hand sanitizer that contains at least 60% alcohol. Clean and disinfect  Clean AND disinfect frequently touched surfaces daily. This includes tables, doorknobs, light switches, countertops, handles, desks, phones, keyboards, toilets, faucets, and sinks. ktimeonline.com  If surfaces are dirty, clean them: Use detergent or soap and water prior to disinfection.  Then, use a household disinfectant. You can see a list of EPA-registered household disinfectants here. [employer]AmericaFlowers.co.uk 03/19/2019 This information is not intended to replace advice given to you by your health care provider. Make sure you discuss any questions you have with your health care provider. Document Revised: 03/27/2019 Document Reviewed: 01/23/2019 Elsevier Patient Education  2020 Elsevier Inc.   Patient scheduled for outpatient Remdesivir infusions at 1pm on Saturday 9/18 and Sunday 9/19 at [facility]. Please inform the patient to park at [address] [place], [place], as staff will be escorting the patient through the east entrance of the hospital. Appointments take approximately 45 minutes.    There is a wave flag banner located near the entrance on [address]. Turn into this entrance and immediately turn left and park in 1 of the 5 designated Covid Infusion Parking spots. There is a phone number on the sign, please call and let the staff know what spot you are in and we will come out and get you. For questions call [phone].  Thanks.

## 2020-04-02 NOTE — Discharge Summary (Signed)
[facility] - [place] at [facility]   PATIENT NAME: Kyle Miles    MR#:  [id number]  DATE OF BIRTH:  [birth year]  DATE OF ADMISSION:  03/31/2020 ADMITTING PHYSICIAN: Lucile Shutters, MD  DATE OF DISCHARGE: 04/02/2020  1:00 PM  PRIMARY CARE PHYSICIAN: Patient, No Pcp Per    ADMISSION DIAGNOSIS:  Acute hypoxemic respiratory failure due to COVID-19 (HCC) [U07.1, J96.01] Pneumonia due to COVID-19 virus [U07.1, J12.82]  DISCHARGE DIAGNOSIS:  Active Problems:   Acute hypoxemic respiratory failure due to COVID-19 (HCC)   Pneumonia due to COVID-19 virus   Impaired fasting glucose   SECONDARY DIAGNOSIS:  History reviewed. No pertinent past medical history.  HOSPITAL COURSE:   1.  Acute hypoxic respiratory failure secondary to COVID-19.  The patient required oxygen initially on coming in with his pulse ox into the 80s.  With ambulation yesterday did drop his pulse ox into the 80s with ambulation.  Today with ambulation he was able to hold his saturations in the 90s with ambulation and without oxygen.  This has resolved. 2.  Multifocal COVID-19 pneumonia.  The patient finished remdesivir day 3 here.  He will be finishing up the course of remdesivir 2 more days at outpatient at [facility] at 1 PM on 04/03/2020 and 04/04/2020.  I switched him over from Decadron to Solu-Medrol yesterday and will continue prednisone for another 7 days.  Continue vitamin C and zinc. 3.  Impaired fasting glucose.  Patient is not a diabetic with his hemoglobin A1c at 6.2.  With hemoglobin A1c is over 6 we are at higher risk to develop diabetes in the future.  Low-carb diet exercise and weight loss discussed. 4.  Elevated liver function test secondary to COVID-19  DISCHARGE CONDITIONS:  Satisfactory  CONSULTS OBTAINED:  None  DRUG ALLERGIES:  No Known Allergies  DISCHARGE MEDICATIONS:   Allergies as of 04/02/2020   No Known Allergies     Medication List    STOP taking these medications    Cinnamon 500 MG capsule   Fish Oil 1000 MG Caps   multivitamin tablet   phentermine 37.5 MG tablet Commonly known as: ADIPEX-P     TAKE these medications   albuterol 108 (90 Base) MCG/ACT inhaler Commonly known as: VENTOLIN HFA Inhale 2 puffs into the lungs every 6 (six) hours.   ascorbic acid 500 MG tablet Commonly known as: VITAMIN C Take 1 tablet (500 mg total) by mouth daily. Start taking on: April 03, 2020   predniSONE 50 MG tablet Commonly known as: DELTASONE Take 1 tablet (50 mg total) by mouth daily.   zinc sulfate 220 (50 Zn) MG capsule Take 1 capsule (220 mg total) by mouth daily. Start taking on: April 03, 2020        DISCHARGE INSTRUCTIONS:   Follow-up new PMD 2 weeks  If you experience worsening of your admission symptoms, develop shortness of breath, life threatening emergency, suicidal or homicidal thoughts you must seek medical attention immediately by calling 911 or calling your MD immediately  if symptoms less severe.  You Must read complete instructions/literature along with all the possible adverse reactions/side effects for all the Medicines you take and that have been prescribed to you. Take any new Medicines after you have completely understood and accept all the possible adverse reactions/side effects.   Please note  You were cared for by a hospitalist during your hospital stay. If you have any questions about your discharge medications or the care you received while you  were in the hospital after you are discharged, you can call the unit and asked to speak with the hospitalist on call if the hospitalist that took care of you is not available. Once you are discharged, your primary care physician will handle any further medical issues. Please note that NO REFILLS for any discharge medications will be authorized once you are discharged, as it is imperative that you return to your primary care physician (or establish a relationship with a  primary care physician if you do not have one) for your aftercare needs so that they can reassess your need for medications and monitor your lab values.    Today   CHIEF COMPLAINT:   Chief Complaint  Patient presents with  . Covid Exposure  . Shortness of Breath    HISTORY OF PRESENT ILLNESS:  Kyle Miles  is a 49 y.o. male presents with shortness of breath and found to have COVID-19 infection   VITAL SIGNS:  Blood pressure 125/76, pulse 70, temperature 97.7 F (36.5 C), temperature source Oral, resp. rate 18, height 5\' 9"  (1.753 m), weight 113.4 kg, SpO2 94 %.  I/O:  No intake or output data in the 24 hours ending 04/02/20 1637  PHYSICAL EXAMINATION:  GENERAL:  49 y.o.-year-old patient lying in the bed with no acute distress.  EYES: No scleral icterus. HEENT: Head atraumatic, normocephalic. Oropharynx and nasopharynx clear.  LUNGS: Decreased breath sounds bilateral bases, no wheezing, rales,rhonchi or crepitation. No use of accessory muscles of respiration.  CARDIOVASCULAR: S1, S2 normal. No murmurs, rubs, or gallops.  ABDOMEN: Soft, non-tender.  EXTREMITIES: No pedal edema.  NEUROLOGIC: Cranial nerves II through XII are intact. Muscle strength 5/5 in all extremities. Sensation intact. Gait not checked.  PSYCHIATRIC: The patient is alert and oriented x 3.  SKIN: No obvious rash, lesion, or ulcer.   DATA REVIEW:   CBC Recent Labs  Lab 04/02/20 0447  WBC 6.9  HGB 12.9*  HCT 36.8*  PLT 429*    Chemistries  Recent Labs  Lab 04/02/20 0447  NA 139  K 4.6  CL 104  CO2 26  GLUCOSE 185*  BUN 20  CREATININE 0.89  CALCIUM 8.2*  MG 2.4  AST 42*  ALT 104*  ALKPHOS 57  BILITOT 0.6     Microbiology Results  Results for orders placed or performed during the hospital encounter of 08/03/19  SARS CORONAVIRUS 2 (TAT 6-24 HRS) Nasopharyngeal Nasopharyngeal Swab     Status: None   Collection Time: 08/03/19 12:28 PM   Specimen: Nasopharyngeal Swab  Result Value  Ref Range Status   SARS Coronavirus 2 NEGATIVE NEGATIVE Final    Comment: (NOTE) SARS-CoV-2 target nucleic acids are NOT DETECTED. The SARS-CoV-2 RNA is generally detectable in upper and lower respiratory specimens during the acute phase of infection. Negative results do not preclude SARS-CoV-2 infection, do not rule out co-infections with other pathogens, and should not be used as the sole basis for treatment or other patient management decisions. Negative results must be combined with clinical observations, patient history, and epidemiological information. The expected result is Negative. Fact Sheet for Patients: 08/05/19 Fact Sheet for Healthcare Providers: HairSlick.no This test is not yet approved or cleared by the quierodirigir.com FDA and  has been authorized for detection and/or diagnosis of SARS-CoV-2 by FDA under an Emergency Use Authorization (EUA). This EUA will remain  in effect (meaning this test can be used) for the duration of the COVID-19 declaration under Section 56 4(b)(1) of the Act,  21 U.S.C. section 360bbb-3(b)(1), unless the authorization is terminated or revoked sooner. Performed at [facility], [address], [place], [place] [address]     Management plans discussed with the patient, family and they are in agreement.  CODE STATUS:     Code Status Orders  (From admission, onward)         Start     Ordered   03/31/20 1023  Full code  Continuous        03/31/20 1024        Code Status History    This patient has a current code status but no historical code status.   Advance Care Planning Activity      TOTAL TIME TAKING CARE OF THIS PATIENT: 35 minutes.    Alford Highland M.D on 04/02/2020 at 4:37 PM  Between 7am to 6pm - Pager - [phone]  After 6pm go to [email or web address] - password [email or web address]  [facility]  CC: Primary care physician; Patient, No Pcp Per

## 2020-04-03 ENCOUNTER — Ambulatory Visit (HOSPITAL_COMMUNITY)
Admit: 2020-04-03 | Discharge: 2020-04-03 | Disposition: A | Discharge status: Home / Self Care | Payer: BC Managed Care – PPO | Attending: Pulmonary Disease | Admitting: Pulmonary Disease

## 2020-04-03 DIAGNOSIS — U071 COVID-19: Secondary | ICD-10-CM | POA: Insufficient documentation

## 2020-04-03 MED ORDER — METHYLPREDNISOLONE SODIUM SUCC 125 MG IJ SOLR: 125.0000 mg | Freq: Once | INTRAMUSCULAR | Status: DC | PRN

## 2020-04-03 MED ORDER — SODIUM CHLORIDE 0.9 % IV SOLN: INTRAVENOUS | Status: DC | PRN

## 2020-04-03 MED ORDER — SODIUM CHLORIDE 0.9 % IV SOLN
100.0000 mg | Freq: Once | INTRAVENOUS | Status: AC
  Administered 2020-04-03: 100 mg via INTRAVENOUS
  Filled 2020-04-03: qty 20

## 2020-04-03 MED ORDER — EPINEPHRINE 0.3 MG/0.3ML IJ SOAJ: 0.3000 mg | Freq: Once | INTRAMUSCULAR | Status: DC | PRN

## 2020-04-03 MED ORDER — ALBUTEROL SULFATE HFA 108 (90 BASE) MCG/ACT IN AERS: 2.0000 | INHALATION_SPRAY | Freq: Once | RESPIRATORY_TRACT | Status: DC | PRN

## 2020-04-03 MED ORDER — FAMOTIDINE IN NACL 20-0.9 MG/50ML-% IV SOLN: 20.0000 mg | Freq: Once | INTRAVENOUS | Status: DC | PRN

## 2020-04-03 MED ORDER — DIPHENHYDRAMINE HCL 50 MG/ML IJ SOLN: 50.0000 mg | Freq: Once | INTRAMUSCULAR | Status: DC | PRN

## 2020-04-03 NOTE — Progress Notes (Signed)
  Diagnosis: COVID-19  Physician: Dr Wright  Procedure: Covid Infusion Clinic Med: remdesivir infusion - Provided patient with remdesivir fact sheet for patients, parents and caregivers prior to infusion.  Complications: No immediate complications noted.    Discharge: Discharged home   Kyle Miles 04/03/2020   

## 2020-04-03 NOTE — Discharge Instructions (Signed)
What types of side effects do monoclonal antibody drugs cause?  Common side effects  In general, the more common side effects caused by monoclonal antibody drugs include: . Allergic reactions, such as hives or itching . Flu-like signs and symptoms, including chills, fatigue, fever, and muscle aches and pains . Nausea, vomiting . Diarrhea . Skin rashes . Low blood pressure   The CDC is recommending patients who receive monoclonal antibody treatments wait at least 90 days before being vaccinated.  Currently, there are no data on the safety and efficacy of mRNA COVID-19 vaccines in persons who received monoclonal antibodies or convalescent plasma as part of COVID-19 treatment. Based on the estimated half-life of such therapies as well as evidence suggesting that reinfection is uncommon in the 90 days after initial infection, vaccination should be deferred for at least 90 days, as a precautionary measure until additional information becomes available, to avoid interference of the antibody treatment with vaccine-induced immune responses.   10 Things You Can Do to Manage Your COVID-19 Symptoms at Home If you have possible or confirmed COVID-19: Stay home from work and school. And stay away from other public places. If you must go out, avoid using any kind of public transportation, ridesharing, or taxis. Monitor your symptoms carefully. If your symptoms get worse, call your healthcare provider immediately. Get rest and stay hydrated. If you have a medical appointment, call the healthcare provider ahead of time and tell them that you have or may have COVID-19. For medical emergencies, call 911 and notify the dispatch personnel that you have or may have COVID-19. Cover your cough and sneezes with a tissue or use the inside of your elbow. Wash your hands often with soap and water for at least 20 seconds or clean your hands with an alcohol-based hand sanitizer that contains at least 60% alcohol. As  much as possible, stay in a specific room and away from other people in your home. Also, you should use a separate bathroom, if available. If you need to be around other people in or outside of the home, wear a mask. Avoid sharing personal items with other people in your household, like dishes, towels, and bedding. Clean all surfaces that are touched often, like counters, tabletops, and doorknobs. Use household cleaning sprays or wipes according to the label instructions. [employer] 01/15/2019 This information is not intended to replace advice given to you by your health care provider. Make sure you discuss any questions you have with your health care provider. Document Revised: 06/19/2019 Document Reviewed: 06/19/2019 Elsevier Patient Education  2020 [employer].

## 2020-04-04 ENCOUNTER — Ambulatory Visit (HOSPITAL_COMMUNITY)
Admit: 2020-04-04 | Discharge: 2020-04-04 | Disposition: A | Discharge status: Home / Self Care | Payer: BC Managed Care – PPO | Source: Ambulatory Visit | Attending: Pulmonary Disease | Admitting: Pulmonary Disease

## 2020-04-04 DIAGNOSIS — J1282 Pneumonia due to coronavirus disease 2019: Secondary | ICD-10-CM | POA: Diagnosis not present

## 2020-04-04 DIAGNOSIS — U071 COVID-19: Secondary | ICD-10-CM | POA: Insufficient documentation

## 2020-04-04 MED ORDER — ALBUTEROL SULFATE HFA 108 (90 BASE) MCG/ACT IN AERS: 2.0000 | INHALATION_SPRAY | Freq: Once | RESPIRATORY_TRACT | Status: DC | PRN

## 2020-04-04 MED ORDER — METHYLPREDNISOLONE SODIUM SUCC 125 MG IJ SOLR: 125.0000 mg | Freq: Once | INTRAMUSCULAR | Status: DC | PRN

## 2020-04-04 MED ORDER — DIPHENHYDRAMINE HCL 50 MG/ML IJ SOLN: 50.0000 mg | Freq: Once | INTRAMUSCULAR | Status: DC | PRN

## 2020-04-04 MED ORDER — EPINEPHRINE 0.3 MG/0.3ML IJ SOAJ: 0.3000 mg | Freq: Once | INTRAMUSCULAR | Status: DC | PRN

## 2020-04-04 MED ORDER — FAMOTIDINE IN NACL 20-0.9 MG/50ML-% IV SOLN: 20.0000 mg | Freq: Once | INTRAVENOUS | Status: DC | PRN

## 2020-04-04 MED ORDER — SODIUM CHLORIDE 0.9 % IV SOLN: INTRAVENOUS | Status: DC | PRN

## 2020-04-04 MED ORDER — SODIUM CHLORIDE 0.9 % IV SOLN
100.0000 mg | Freq: Once | INTRAVENOUS | Status: AC
  Administered 2020-04-04: 100 mg via INTRAVENOUS
  Filled 2020-04-04: qty 20

## 2020-04-04 NOTE — Progress Notes (Addendum)
  Diagnosis: COVID-19  Physician: Dr. Shan Levans  Procedure: [facility] Med: remdesivir infusion - Provided patient with remdesivir fact sheet for patients, parents and caregivers prior to infusion.  Complications: No immediate complications noted.  Discharge: Discharged home   Essie Hart 04/04/2020

## 2020-04-04 NOTE — Discharge Instructions (Signed)
10 Things You Can Do to Manage Your COVID-19 Symptoms at Home If you have possible or confirmed COVID-19: 1. Stay home from work and school. And stay away from other public places. If you must go out, avoid using any kind of public transportation, ridesharing, or taxis. 2. Monitor your symptoms carefully. If your symptoms get worse, call your healthcare provider immediately. 3. Get rest and stay hydrated. 4. If you have a medical appointment, call the healthcare provider ahead of time and tell them that you have or may have COVID-19. 5. For medical emergencies, call 911 and notify the dispatch personnel that you have or may have COVID-19. 6. Cover your cough and sneezes with a tissue or use the inside of your elbow. 7. Wash your hands often with soap and water for at least 20 seconds or clean your hands with an alcohol-based hand sanitizer that contains at least 60% alcohol. 8. As much as possible, stay in a specific room and away from other people in your home. Also, you should use a separate bathroom, if available. If you need to be around other people in or outside of the home, wear a mask. 9. Avoid sharing personal items with other people in your household, like dishes, towels, and bedding. 10. Clean all surfaces that are touched often, like counters, tabletops, and doorknobs. Use household cleaning sprays or wipes according to the label instructions. cdc.gov/coronavirus 01/15/2019 This information is not intended to replace advice given to you by your health care provider. Make sure you discuss any questions you have with your health care provider. Document Revised: 06/19/2019 Document Reviewed: 06/19/2019 Elsevier Patient Education  2020 Elsevier Inc.  

## 2020-04-15 DIAGNOSIS — U071 COVID-19: Secondary | ICD-10-CM | POA: Diagnosis not present

## 2020-06-15 ENCOUNTER — Other Ambulatory Visit: Payer: Self-pay

## 2020-06-15 ENCOUNTER — Ambulatory Visit (INDEPENDENT_AMBULATORY_CARE_PROVIDER_SITE_OTHER): Payer: BC Managed Care – PPO | Admitting: Family Medicine

## 2020-06-15 ENCOUNTER — Other Ambulatory Visit: Payer: Self-pay | Admitting: Family Medicine

## 2020-06-15 ENCOUNTER — Encounter: Payer: Self-pay | Admitting: Family Medicine

## 2020-06-15 DIAGNOSIS — G4733 Obstructive sleep apnea (adult) (pediatric): Secondary | ICD-10-CM

## 2020-06-15 DIAGNOSIS — R7303 Prediabetes: Secondary | ICD-10-CM

## 2020-06-15 DIAGNOSIS — M542 Cervicalgia: Secondary | ICD-10-CM

## 2020-06-15 DIAGNOSIS — Z7689 Persons encountering health services in other specified circumstances: Secondary | ICD-10-CM | POA: Diagnosis not present

## 2020-06-15 DIAGNOSIS — Z125 Encounter for screening for malignant neoplasm of prostate: Secondary | ICD-10-CM

## 2020-06-15 DIAGNOSIS — Z0000 Encounter for general adult medical examination without abnormal findings: Secondary | ICD-10-CM

## 2020-06-15 DIAGNOSIS — Z1159 Encounter for screening for other viral diseases: Secondary | ICD-10-CM

## 2020-06-15 DIAGNOSIS — M545 Low back pain, unspecified: Secondary | ICD-10-CM

## 2020-06-15 DIAGNOSIS — G8929 Other chronic pain: Secondary | ICD-10-CM | POA: Insufficient documentation

## 2020-06-15 DIAGNOSIS — R030 Elevated blood-pressure reading, without diagnosis of hypertension: Secondary | ICD-10-CM

## 2020-06-15 DIAGNOSIS — I10 Essential (primary) hypertension: Secondary | ICD-10-CM | POA: Insufficient documentation

## 2020-06-15 NOTE — Progress Notes (Signed)
Subjective:    Patient ID: Kyle Miles, male    DOB: [birth year], 49 y.o.   MRN: [id number]  Kyle Miles is a 49 y.o. 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  Previous PCP [name].  HPI   OSA, not on CPAP Morbid Obesity BMI >38 Previous history of snoring and his prior PCP few years ago tested for Sleep Apnea with PSG and confirmed. No longer using CPAP by his preference - He has followed with [facility] in [place] - he was treated with Phentermine and hormone therapy.  Pre-Diabetes New problem, identified in hospital had elevated sugars 140-180. A1c 6.2%, was unaware of this.  Elevated BP without HTN Reports history of elevated BP in past. Never on HTN med.  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 He has episodic sharp pain with movements. Not daily, has low back pain as well very similar seems to radiate in his spine. Interested in X-rays never had done before.  Low Testosterone Previously seen by Urologist in 2017, for fatigue Testing at that time, showed some low testosterone, considered treatment but never followed up on it.  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  PMH - COVID He had issue with recent COVID diagnosis in September 2021. Also his wife was dx with COVID, both were unvaccinated.   Health Maintenance: Not vaccinated against COVID19. He received antibody infusion 9/18 soonest he can get vaccine will be 90 days after 07/03/20.    Depression screen PHQ 2/9  06/15/2020  Decreased Interest 0  Down, Depressed, Hopeless 0  PHQ - 2 Score 0    Past Medical History:  Diagnosis Date  . Sleep apnea    History reviewed. No pertinent surgical history. Social History   Socioeconomic History  . Marital status: Married    Spouse name: Not on file  . Number of children: Not on file  . Years of education: Not on file  . Highest education level: Not on file  Occupational History  . Not on file  Tobacco Use  . Smoking status: Never Smoker  . Smokeless tobacco: Never Used  Substance and Sexual Activity  . Alcohol use: No  . Drug use: Never  . Sexual activity: Not on file  Other Topics Concern  . Not on file  Social History Narrative  . Not on file   Social Determinants of Health   Financial Resource Strain:   . Difficulty of Paying Living Expenses: Not on file  Food Insecurity:   . Worried About Programme researcher, broadcasting/film/video in the Last Year: Not on file  . Ran Out of Food in the Last Year: Not on file  Transportation Needs:   . Lack of Transportation (Medical): Not on file  . Lack of Transportation (Non-Medical): Not on file  Physical Activity:   . Days of Exercise per Week: Not on file  . Minutes of Exercise per Session: Not on file  Stress:   . Feeling of  Stress : Not on file  Social Connections:   . Frequency of Communication with Friends and Family: Not on file  . Frequency of Social Gatherings with Friends and Family: Not on file  . Attends Religious Services: Not on file  . Active Member of Clubs or Organizations: Not on file  . Attends Banker Meetings: Not on file  . Marital Status: Not on file  Intimate Partner Violence:   . Fear of Current or Ex-Partner: Not on file  . Emotionally Abused: Not on file  . Physically Abused: Not on file  . Sexually Abused: Not on file   Family History  Problem Relation Age of Onset  . Cancer Mother   . Kidney cancer Neg Hx   . Kidney disease Neg Hx   . Prostate cancer Neg Hx     Current Outpatient Medications on File Prior to Visit  Medication Sig  . albuterol (VENTOLIN HFA) 108 (90 Base) MCG/ACT inhaler Inhale 2 puffs into the lungs every 6 (six) hours.  Marland Kitchen ascorbic acid (VITAMIN C) 500 MG tablet Take 1 tablet (500 mg total) by mouth daily.  Marland Kitchen zinc sulfate 220 (50 Zn) MG capsule Take 1 capsule (220 mg total) by mouth daily.  . phentermine (ADIPEX-P) 37.5 MG tablet Take 37.5 mg by mouth at bedtime.   No current facility-administered medications on file prior to visit.    Review of Systems Per HPI unless specifically indicated above      Objective:    BP 140/84 (BP Location: Left Arm, Cuff Size: Normal)   Pulse 83   Temp 98.4 F (36.9 C) (Temporal)   Resp 16   Ht  (1.753 m)   Wt 259 lb 9.6 oz (117.8 kg)   SpO2 97%   BMI 38.34 kg/m   Wt Readings from Last 3 Encounters:  06/15/20 259 lb 9.6 oz (117.8 kg)  03/30/20 250 lb (113.4 kg)  03/27/20 250 lb (113.4 kg)    Physical Exam Vitals and nursing note reviewed.  Constitutional:      General: He is not in acute distress.    Appearance: He is well-developed. He is obese. He is not diaphoretic.     Comments: Well-appearing, comfortable, cooperative  HENT:     Head: Normocephalic and atraumatic.  Eyes:     General:        Right eye: No discharge.        Left eye: No discharge.     Conjunctiva/sclera: Conjunctivae normal.  Neck:     Thyroid: No thyromegaly.  Cardiovascular:     Rate and Rhythm: Normal rate and regular rhythm.     Heart sounds: Normal heart sounds. No murmur heard.   Pulmonary:     Effort: Pulmonary effort is normal. No respiratory distress.     Breath sounds: Normal breath sounds. No wheezing or rales.  Musculoskeletal:        General: Normal range of motion.     Cervical back: Normal range of motion and neck supple.     Comments: Low Back Large body habitus Non point tender Some paraspinal spasm of muscle Negative SLR  Lymphadenopathy:     Cervical: No cervical  adenopathy.  Skin:    General: Skin is warm and dry.     Findings: No erythema or rash.  Neurological:     Mental Status: He is alert and oriented to person, place, and time.  Psychiatric:        Behavior: Behavior normal.  Comments: Well groomed, good eye contact, normal speech and thoughts        Results for orders placed or performed during the hospital encounter of 03/31/20  Basic metabolic panel  Result Value Ref Range   Sodium 137 135 - 145 mmol/L   Potassium 4.1 3.5 - 5.1 mmol/L   Chloride 98 98 - 111 mmol/L   CO2 28 22 - 32 mmol/L   Glucose, Bld 111 (H) 70 - 99 mg/dL   BUN 17 6 - 20 mg/dL   Creatinine, Ser 1.611.21 0.61 - 1.24 mg/dL   Calcium 8.4 (L) 8.9 - 10.3 mg/dL   GFR calc non Af Amer >60 >60 mL/min   GFR calc Af Amer >60 >60 mL/min   Anion gap 11 5 - 15  CBC  Result Value Ref Range   WBC 5.5 4.0 - 10.5 K/uL   RBC 4.97 4.22 - 5.81 MIL/uL   Hemoglobin 14.2 13.0 - 17.0 g/dL   HCT 09.641.1 39 - 52 %   MCV 82.7 80.0 - 100.0 fL   MCH 28.6 26.0 - 34.0 pg   MCHC 34.5 30.0 - 36.0 g/dL   RDW 04.512.6 40.911.5 - 81.115.5 %   Platelets 336 150 - 400 K/uL   nRBC 0.0 0.0 - 0.2 %  HIV Antibody (routine testing w rflx)  Result Value Ref Range   HIV Screen 4th Generation wRfx Non Reactive Non Reactive  CBC with Differential/Platelet  Result Value Ref Range   WBC 3.9 (L) 4.0 - 10.5 K/uL   RBC 4.33 4.22 - 5.81 MIL/uL   Hemoglobin 12.2 (L) 13.0 - 17.0 g/dL   HCT 91.436.6 (L) 39 - 52 %   MCV 84.5 80.0 - 100.0 fL   MCH 28.2 26.0 - 34.0 pg   MCHC 33.3 30.0 - 36.0 g/dL   RDW 78.212.5 95.611.5 - 21.315.5 %   Platelets 358 150 - 400 K/uL   nRBC 0.0 0.0 - 0.2 %   Neutrophils Relative % 76 %   Neutro Abs 3.0 1.7 - 7.7 K/uL   Lymphocytes Relative 15 %   Lymphs Abs 0.6 (L) 0.7 - 4.0 K/uL   Monocytes Relative 8 %   Monocytes Absolute 0.3 0.1 - 1.0 K/uL   Eosinophils Relative 0 %   Eosinophils Absolute 0.0 0.0 - 0.5 K/uL   Basophils Relative 0 %   Basophils Absolute 0.0 0.0 - 0.1 K/uL   Immature  Granulocytes 1 %   Abs Immature Granulocytes 0.02 0.00 - 0.07 K/uL  Comprehensive metabolic panel  Result Value Ref Range   Sodium 137 135 - 145 mmol/L   Potassium 4.3 3.5 - 5.1 mmol/L   Chloride 102 98 - 111 mmol/L   CO2 26 22 - 32 mmol/L   Glucose, Bld 156 (H) 70 - 99 mg/dL   BUN 17 6 - 20 mg/dL   Creatinine, Ser 0.861.00 0.61 - 1.24 mg/dL   Calcium 8.1 (L) 8.9 - 10.3 mg/dL   Total Protein 6.6 6.5 - 8.1 g/dL   Albumin 2.9 (L) 3.5 - 5.0 g/dL   AST 69 (H) 15 - 41 U/L   ALT 112 (H) 0 - 44 U/L   Alkaline Phosphatase 64 38 - 126 U/L   Total Bilirubin 0.6 0.3 - 1.2 mg/dL   GFR calc non Af Amer >60 >60 mL/min   GFR calc Af Amer >60 >60 mL/min   Anion gap 9 5 - 15  C-reactive protein  Result Value Ref Range   CRP 3.8 (H) <  1.0 mg/dL  Fibrin derivatives D-Dimer ([facility] only)  Result Value Ref Range   Fibrin derivatives D-dimer ([facility]) 632.78 (H) 0.00 - 499.00 ng/mL (FEU)  Ferritin  Result Value Ref Range   Ferritin 302 24 - 336 ng/mL  Magnesium  Result Value Ref Range   Magnesium 2.4 1.7 - 2.4 mg/dL  Phosphorus  Result Value Ref Range   Phosphorus 3.5 2.5 - 4.6 mg/dL  Hemoglobin E8B  Result Value Ref Range   Hgb A1c MFr Bld 6.2 (H) 4.8 - 5.6 %   Mean Plasma Glucose 131.24 mg/dL  CBC with Differential/Platelet  Result Value Ref Range   WBC 6.9 4.0 - 10.5 K/uL   RBC 4.48 4.22 - 5.81 MIL/uL   Hemoglobin 12.9 (L) 13.0 - 17.0 g/dL   HCT 15.1 (L) 39 - 52 %   MCV 82.1 80.0 - 100.0 fL   MCH 28.8 26.0 - 34.0 pg   MCHC 35.1 30.0 - 36.0 g/dL   RDW 76.1 60.7 - 37.1 %   Platelets 429 (H) 150 - 400 K/uL   nRBC 0.0 0.0 - 0.2 %   Neutrophils Relative % 89 %   Neutro Abs 6.1 1.7 - 7.7 K/uL   Lymphocytes Relative 8 %   Lymphs Abs 0.6 (L) 0.7 - 4.0 K/uL   Monocytes Relative 2 %   Monocytes Absolute 0.2 0.1 - 1.0 K/uL   Eosinophils Relative 0 %   Eosinophils Absolute 0.0 0.0 - 0.5 K/uL   Basophils Relative 0 %   Basophils Absolute 0.0 0.0 - 0.1 K/uL   Immature Granulocytes 1 %   Abs  Immature Granulocytes 0.05 0.00 - 0.07 K/uL  Comprehensive metabolic panel  Result Value Ref Range   Sodium 139 135 - 145 mmol/L   Potassium 4.6 3.5 - 5.1 mmol/L   Chloride 104 98 - 111 mmol/L   CO2 26 22 - 32 mmol/L   Glucose, Bld 185 (H) 70 - 99 mg/dL   BUN 20 6 - 20 mg/dL   Creatinine, Ser 0.62 0.61 - 1.24 mg/dL   Calcium 8.2 (L) 8.9 - 10.3 mg/dL   Total Protein 6.7 6.5 - 8.1 g/dL   Albumin 3.0 (L) 3.5 - 5.0 g/dL   AST 42 (H) 15 - 41 U/L   ALT 104 (H) 0 - 44 U/L   Alkaline Phosphatase 57 38 - 126 U/L   Total Bilirubin 0.6 0.3 - 1.2 mg/dL   GFR calc non Af Amer >60 >60 mL/min   GFR calc Af Amer >60 >60 mL/min   Anion gap 9 5 - 15  C-reactive protein  Result Value Ref Range   CRP 1.5 (H) <1.0 mg/dL  Fibrin derivatives D-Dimer ([facility] only)  Result Value Ref Range   Fibrin derivatives D-dimer ([facility]) 504.38 (H) 0.00 - 499.00 ng/mL (FEU)  Ferritin  Result Value Ref Range   Ferritin 298 24 - 336 ng/mL  Magnesium  Result Value Ref Range   Magnesium 2.4 1.7 - 2.4 mg/dL  Phosphorus  Result Value Ref Range   Phosphorus 3.9 2.5 - 4.6 mg/dL  Glucose, capillary  Result Value Ref Range   Glucose-Capillary 155 (H) 70 - 99 mg/dL  Glucose, capillary  Result Value Ref Range   Glucose-Capillary 162 (H) 70 - 99 mg/dL  Glucose, capillary  Result Value Ref Range   Glucose-Capillary 145 (H) 70 - 99 mg/dL  Glucose, capillary  Result Value Ref Range   Glucose-Capillary 150 (H) 70 - 99 mg/dL  Glucose, capillary  Result Value  Ref Range   Glucose-Capillary 184 (H) 70 - 99 mg/dL  Troponin I (High Sensitivity)  Result Value Ref Range   Troponin I (High Sensitivity) 11 <18 ng/L  Troponin I (High Sensitivity)  Result Value Ref Range   Troponin I (High Sensitivity) 14 <18 ng/L      Assessment & Plan:   Problem List Items Addressed This Visit    Pre-diabetes   OSA (obstructive sleep apnea)   Morbid obesity (HCC) - Primary   Relevant Medications   phentermine (ADIPEX-P) 37.5 MG tablet     Elevated BP without diagnosis of hypertension   Chronic neck pain   Relevant Orders   DG Cervical Spine Complete   Chronic bilateral low back pain without sciatica   Relevant Orders   DG Lumbar Spine Complete    Other Visit Diagnoses    Encounter to establish care with new doctor          #Review outside records if available prior PCP [name]  #Elevated BP without HTN Documented elevated BP today, repeat manual improved but still above goal >140 SBP No prior dx and treatment in past Last elevated reading in hospital with COVID Advised to monitor BP closely at home, log readings if consistent >135/85 we can discuss at upcoming visit or sooner and review new dx HTN and BP medication  #Chronic Neck / Back Pain - history spinal whiplash injury >30 yr ago Suspect has chronic manifestation of symptoms from prior injury, suspect may have some post traumatic arthritis or other factors. Seems to have neurological or radicular symptoms at times. Episodic Will check X-rays Cervical Spine and Lumbar spine when ready, ordered for [facility] - can do walk in when ready, before next apt 3 months. Will review results and contact patient, we can review treatment in future.  #OSA, not on CPAP Prior PSG done in past unsure where in [facility]. No longer on CPAP, declines Discussed risk of HTN and other complications of OSA at risk if not using Reconsider in future.  #Pre-Diabetes Obesity Elevated A1c 6.2 in hospital recently with covid, also receiving steroids had CBG 140-180 AT risk of DM2 Will repeat labs with A1c at upcoming physical 3 months Counseling on lifestyle diet excercise  No orders of the defined types were placed in this encounter.     Follow up plan: Return in about 3 months (around 09/13/2020) for 3 month fasting lab only then 1 week later Annual Physical.   Future labs ordered 08/30/20 PSA CMET Lipid A1c CBC TSH Hep C  Saralyn Pilar, DO [facility] 06/15/2020, 3:45 PM

## 2020-06-15 NOTE — Patient Instructions (Addendum)
Thank you for coming to the office today.  X-rays ordered for Neck and Low Back - walk in any time, during business hours Mon - Thurs (not on fridays), prefer not 12 noon to 1-15pm during lunch but they can make arrangements if needed  Result within 24-48 hours. Will call with results.  --------- Elevated BP slightly up on recheck, goal to check BP more often at home. Write down readings, follow-up   Colon Cancer Screening: - For all adults age 51+ routine colon cancer screening is highly recommended.     - Recent guidelines from American Cancer Society recommend starting age of 2 - Early detection of colon cancer is important, because often there are no warning signs or symptoms, also if found early usually it can be cured. Late stage is hard to treat.  - If you are not interested in Colonoscopy screening (if done and normal you could be cleared for 5 to 10 years until next due), then Cologuard is an excellent alternative for screening test for Colon Cancer. It is highly sensitive for detecting DNA of colon cancer from even the earliest stages. Also, there is NO bowel prep required. - If Cologuard is NEGATIVE, then it is good for 3 years before next due - If Cologuard is POSITIVE, then it is strongly advised to get a Colonoscopy, which allows the GI doctor to locate the source of the cancer or polyp (even very early stage) and treat it by removing it. ------------------------- If you would like to proceed with Cologuard (stool DNA test) - FIRST, call your insurance company and tell them you want to check cost of Cologuard tell them CPT Code 49656 (it may be completely covered and you could get for no cost, OR max cost without any coverage is about $600). Also, keep in mind if you do NOT open the kit, and decide not to do the test, you will NOT be charged, you should contact the company if you decide not to do the test. - If you want to proceed, you can notify us (phone message, MyChart  Message, or at next visit) and we will order it for you. The test kit will be delivered to you house within about 1 week. Follow instructions to collect sample, you may call the company for any help or questions, 24/7 telephone support at [phone].    DUE for FASTING BLOOD WORK (no food or drink after midnight before the lab appointment, only water or coffee without cream/sugar on the morning of)  SCHEDULE "Lab Only" visit in the morning at the clinic for lab draw in 3 MONTHS   - Make sure Lab Only appointment is at about 1 week before your next appointment, so that results will be available  For Lab Results, once available within 2-3 days of blood draw, you can can log in to MyChart online to view your results and a brief explanation. Also, we can discuss results at next follow-up visit.   Please schedule a Follow-up Appointment to: Return in about 3 months (around 09/13/2020) for 3 month fasting lab only then 1 week later Annual Physical.  If you have any other questions or concerns, please feel free to call the office or send a message through MyChart. You may also schedule an earlier appointment if necessary.  Additionally, you may be receiving a survey about your experience at our office within a few days to 1 week by e-mail or mail. We value your feedback.  Saralyn Pilar, DO [facility]  [place]

## 2020-08-26 ENCOUNTER — Ambulatory Visit
Admission: RE | Admit: 2020-08-26 | Discharge: 2020-08-26 | Disposition: A | Discharge status: Home / Self Care | Payer: BC Managed Care – PPO | Attending: Family Medicine | Admitting: Family Medicine

## 2020-08-26 ENCOUNTER — Other Ambulatory Visit: Payer: Self-pay

## 2020-08-26 ENCOUNTER — Ambulatory Visit
Admission: RE | Admit: 2020-08-26 | Discharge: 2020-08-26 | Disposition: A | Discharge status: Home / Self Care | Payer: BC Managed Care – PPO | Source: Ambulatory Visit | Attending: Family Medicine | Admitting: Family Medicine

## 2020-08-26 DIAGNOSIS — M545 Low back pain, unspecified: Secondary | ICD-10-CM

## 2020-08-26 DIAGNOSIS — M542 Cervicalgia: Secondary | ICD-10-CM | POA: Diagnosis not present

## 2020-08-26 DIAGNOSIS — G8929 Other chronic pain: Secondary | ICD-10-CM | POA: Diagnosis not present

## 2020-08-27 ENCOUNTER — Encounter: Payer: Self-pay | Admitting: Family Medicine

## 2020-08-27 ENCOUNTER — Other Ambulatory Visit: Payer: Self-pay | Admitting: *Deleted

## 2020-08-27 DIAGNOSIS — Z0000 Encounter for general adult medical examination without abnormal findings: Secondary | ICD-10-CM

## 2020-08-27 DIAGNOSIS — R7303 Prediabetes: Secondary | ICD-10-CM

## 2020-08-27 DIAGNOSIS — R030 Elevated blood-pressure reading, without diagnosis of hypertension: Secondary | ICD-10-CM

## 2020-08-27 DIAGNOSIS — Z125 Encounter for screening for malignant neoplasm of prostate: Secondary | ICD-10-CM

## 2020-08-27 DIAGNOSIS — Z1159 Encounter for screening for other viral diseases: Secondary | ICD-10-CM

## 2020-08-30 ENCOUNTER — Other Ambulatory Visit: Payer: Self-pay

## 2020-08-30 ENCOUNTER — Other Ambulatory Visit: Payer: BC Managed Care – PPO

## 2020-08-30 DIAGNOSIS — R7303 Prediabetes: Secondary | ICD-10-CM | POA: Diagnosis not present

## 2020-08-30 DIAGNOSIS — R030 Elevated blood-pressure reading, without diagnosis of hypertension: Secondary | ICD-10-CM | POA: Diagnosis not present

## 2020-08-30 DIAGNOSIS — Z125 Encounter for screening for malignant neoplasm of prostate: Secondary | ICD-10-CM | POA: Diagnosis not present

## 2020-08-30 DIAGNOSIS — Z1159 Encounter for screening for other viral diseases: Secondary | ICD-10-CM | POA: Diagnosis not present

## 2020-08-31 LAB — CBC WITH DIFFERENTIAL/PLATELET
Absolute Monocytes: 468 cells/uL (ref 200–950)
Basophils Absolute: 39 cells/uL (ref 0–200)
Basophils Relative: 0.7 %
Eosinophils Absolute: 182 cells/uL (ref 15–500)
Eosinophils Relative: 3.3 %
HCT: 43.2 % (ref 38.5–50.0)
Hemoglobin: 14.7 g/dL (ref 13.2–17.1)
Lymphs Abs: 1881 cells/uL (ref 850–3900)
MCH: 28.1 pg (ref 27.0–33.0)
MCHC: 34 g/dL (ref 32.0–36.0)
MCV: 82.6 fL (ref 80.0–100.0)
MPV: 10 fL (ref 7.5–12.5)
Monocytes Relative: 8.5 %
Neutro Abs: 2932 cells/uL (ref 1500–7800)
Neutrophils Relative %: 53.3 %
Platelets: 349 10*3/uL (ref 140–400)
RBC: 5.23 10*6/uL (ref 4.20–5.80)
RDW: 12.8 % (ref 11.0–15.0)
Total Lymphocyte: 34.2 %
WBC: 5.5 10*3/uL (ref 3.8–10.8)

## 2020-08-31 LAB — LIPID PANEL
Cholesterol: 185 mg/dL (ref <200)
HDL: 40 mg/dL (ref >=40)
LDL Cholesterol (Calc): 119 mg/dL (calc) — ABNORMAL HIGH
Non-HDL Cholesterol (Calc): 145 mg/dL (calc) — ABNORMAL HIGH (ref <130)
Total CHOL/HDL Ratio: 4.6 (calc) (ref <5.0)
Triglycerides: 145 mg/dL (ref <150)

## 2020-08-31 LAB — COMPLETE METABOLIC PANEL WITH GFR
AG Ratio: 2 (calc) (ref 1.0–2.5)
ALT: 23 U/L (ref 9–46)
AST: 21 U/L (ref 10–35)
Albumin: 4.3 g/dL (ref 3.6–5.1)
Alkaline phosphatase (APISO): 69 U/L (ref 35–144)
BUN: 14 mg/dL (ref 7–25)
CO2: 28 mmol/L (ref 20–32)
Calcium: 8.9 mg/dL (ref 8.6–10.3)
Chloride: 104 mmol/L (ref 98–110)
Creat: 1.29 mg/dL (ref 0.70–1.33)
GFR, Est African American: 74 mL/min/{1.73_m2} (ref >=60)
GFR, Est Non African American: 64 mL/min/{1.73_m2} (ref >=60)
Globulin: 2.2 g/dL (calc) (ref 1.9–3.7)
Glucose, Bld: 109 mg/dL — ABNORMAL HIGH (ref 65–99)
Potassium: 4.3 mmol/L (ref 3.5–5.3)
Sodium: 140 mmol/L (ref 135–146)
Total Bilirubin: 0.4 mg/dL (ref 0.2–1.2)
Total Protein: 6.5 g/dL (ref 6.1–8.1)

## 2020-08-31 LAB — HEMOGLOBIN A1C
Hgb A1c MFr Bld: 6.1 % of total Hgb — ABNORMAL HIGH (ref <5.7)
Mean Plasma Glucose: 128 mg/dL
eAG (mmol/L): 7.1 mmol/L

## 2020-08-31 LAB — TSH: TSH: 5.62 mIU/L — ABNORMAL HIGH (ref 0.40–4.50)

## 2020-08-31 LAB — HEPATITIS C ANTIBODY
Hepatitis C Ab: NONREACTIVE
SIGNAL TO CUT-OFF: 0.61 (ref <1.00)

## 2020-08-31 LAB — PSA: PSA: 0.56 ng/mL (ref <=4.0)

## 2020-09-06 ENCOUNTER — Other Ambulatory Visit: Payer: Self-pay | Admitting: Family Medicine

## 2020-09-06 ENCOUNTER — Other Ambulatory Visit: Payer: Self-pay

## 2020-09-06 ENCOUNTER — Encounter: Payer: Self-pay | Admitting: Family Medicine

## 2020-09-06 ENCOUNTER — Ambulatory Visit (INDEPENDENT_AMBULATORY_CARE_PROVIDER_SITE_OTHER): Payer: BC Managed Care – PPO | Admitting: Family Medicine

## 2020-09-06 VITALS — BP 144/80 | HR 87 | Ht 69.0 in | Wt 263.4 lb

## 2020-09-06 DIAGNOSIS — R7303 Prediabetes: Secondary | ICD-10-CM | POA: Diagnosis not present

## 2020-09-06 DIAGNOSIS — M5136 Other intervertebral disc degeneration, lumbar region: Secondary | ICD-10-CM

## 2020-09-06 DIAGNOSIS — M545 Low back pain, unspecified: Secondary | ICD-10-CM

## 2020-09-06 DIAGNOSIS — M5030 Other cervical disc degeneration, unspecified cervical region: Secondary | ICD-10-CM

## 2020-09-06 DIAGNOSIS — Z0000 Encounter for general adult medical examination without abnormal findings: Secondary | ICD-10-CM | POA: Diagnosis not present

## 2020-09-06 DIAGNOSIS — Z1211 Encounter for screening for malignant neoplasm of colon: Secondary | ICD-10-CM

## 2020-09-06 DIAGNOSIS — I10 Essential (primary) hypertension: Secondary | ICD-10-CM

## 2020-09-06 DIAGNOSIS — R7989 Other specified abnormal findings of blood chemistry: Secondary | ICD-10-CM

## 2020-09-06 DIAGNOSIS — G8929 Other chronic pain: Secondary | ICD-10-CM

## 2020-09-06 DIAGNOSIS — M542 Cervicalgia: Secondary | ICD-10-CM

## 2020-09-06 DIAGNOSIS — M51369 Other intervertebral disc degeneration, lumbar region without mention of lumbar back pain or lower extremity pain: Secondary | ICD-10-CM | POA: Insufficient documentation

## 2020-09-06 MED ORDER — GABAPENTIN 100 MG PO CAPS: ORAL_CAPSULE | ORAL | 1 refills | Status: DC

## 2020-09-06 MED ORDER — AMLODIPINE BESYLATE 5 MG PO TABS: 5.0000 mg | ORAL_TABLET | Freq: Every day | ORAL | 5 refills | Status: DC

## 2020-09-06 NOTE — Patient Instructions (Addendum)
Thank you for coming to the office today.  Start Amlodipine 5mg  daily for blood pressure Keep track of BP, goal is < 140/90 If still elevated BP - call and we can order a 10mg  dosage  Start Gabapentin 100mg  capsules, take at night for 2-3 nights only, and then increase to 2 times a day for a few days, and then may increase to 3 times a day, it may make you drowsy, if helps significantly at night only, then you can increase instead to 3 capsules at night, instead of 3 times a day - In the future if needed, we can significantly increase the dose if tolerated well, some common doses are 300mg  three times a day up to 600mg  three times a day, usually it takes several weeks or months to get to higher doses  If neck and back still bothering you, we can refer to a Spine Specialist - can [facility] to [facility]  Recent Labs    04/01/20 0524 08/30/20 0835  HGBA1C 6.2* 6.1*   Goal to improve sugar and low carb diet   Eat at least 3 meals and 1-2 snacks per day (don't skip breakfast).  Aim for no more than 5 hours between eating. - Tip: If you go >5 hours without eating and become very hungry, your body will supply it's own resources temporarily and you can gain extra weight when you eat.   Diet Recommendations for Preventing Diabetes   REDUCE Starchy (carb) foods include: Bread, rice, pasta, potatoes, corn, crackers, bagels, muffins, all baked goods.   Protein foods include: Meat, fish, poultry, eggs, dairy foods, and beans such as pinto and kidney beans (beans also provide carbohydrate).   1. Eat at least 3 meals and 1-2 snacks per day. Never go more than 4-5 hours while awake without eating.   2. Limit starchy foods to TWO per meal and ONE per snack. ONE portion of a starchy  food is equal to the following:   - ONE slice of bread (or its equivalent, such as half of a hamburger bun).   - 1/2 cup of a "scoopable" starchy food such as potatoes or rice.   - 1 OUNCE (28 grams) of  starchy snacks (crackers or pretzels, look on label).   - 15 grams of carbohydrate as shown on food label.   3. Both lunch and dinner should include a protein food, a carb food, and vegetables.   - Obtain twice as many veg's as protein or carbohydrate foods for both lunch and dinner.   - Try to keep frozen veg's on hand for a quick vegetable serving.     - Fresh or frozen veg's are best.   4. Breakfast should always include protein.     ORDERED COLOGUARD  Follow instructions to collect sample, you may call the company for any help or questions, 24/7 telephone support at [phone].     Please schedule a Follow-up Appointment to: Return in about 6 months (around 03/06/2021) for 6 month fasting labs then 1 week later Follow-up HTN, PreDM, Thyroid.  If you have any other questions or concerns, please feel free to call the office or send a message through MyChart. You may also schedule an earlier appointment if necessary.  Additionally, you may be receiving a survey about your experience at our office within a few days to 1 week by e-mail or mail. We value your feedback.  [facility] BURDICK, DO [facility], 09/01/20

## 2020-09-06 NOTE — Assessment & Plan Note (Signed)
Improved A1c to 6.1 Concern with obesity, HTN, HLD  Plan:  1. Not on any therapy currently  2. Encourage improved lifestyle - low carb, low sugar diet, reduce portion size, continue improving regular exercise 3. Follow-up 6 month labs

## 2020-09-06 NOTE — Assessment & Plan Note (Signed)
Mildly elevated initial BP, repeat manual check improved. No known complications    Plan:  1. Start Amlodipine 5mg  daily for blood pressure Keep track of BP, goal is < 140/90 If still elevated BP - call and we can order a 10mg  dosage Encourage improved lifestyle - low sodium diet, regular exercise  F/u within 6 month

## 2020-09-06 NOTE — Assessment & Plan Note (Signed)
Encourage lifestyle diet exercise wt loss 

## 2020-09-06 NOTE — Progress Notes (Signed)
Subjective:    Patient ID: Kyle Miles, male    DOB: 1970-10-05, 50 y.o.   MRN: [id number]  Kyle Miles is a 50 y.o. male presenting on 09/06/2020 for Annual Exam and Back Pain   HPI   Here for Annual Physical and Lab Review  OSA, not on CPAP Morbid Obesity BMI >38 Previous history of snoring and his prior PCP few years ago tested for Sleep Apnea with PSG and confirmed. No longer using CPAP by his preference - He has followed with [facility] in [place] - he was treated with Phentermine and hormone therapy.  Pre-Diabetes Last lab result A1c 6.1%, in range of pre diabetes. Last time 6.2% 03/2020 in hospital.  Elevated BP without HTN Reports history of elevated BP in past. Never on HTN med.  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 He has episodic sharp pain with movements. Not daily, has low back pain as well very similar seems to radiate in his spine. - Recent X-rays C-spine and L-spine done 08/2020 - He has chronic pain in neck and pain, intermittently, some days mild other days more severe  Low Testosterone Previously seen by Urologist in 2017, for fatigue Testing at that time, showed some low testosterone, considered treatment but never followed up on it.  Elevated TSH Mild elevated up to 5.62, prior result was normal 2017. Asymptomatic. No fam history.   PMH - COVID He had issue with recent COVID diagnosis in September 2021. Also his wife was dx with COVID, both were unvaccinated.   Health Maintenance: Not vaccinated against COVID19. He received antibody infusion 9/18. He is eligible for vaccine anytime now.  Hep C negative  PSA negative. Check yearly  Due  colon CA Screening, interested in Cologuard.   Depression screen PHQ 2/9 06/15/2020  Decreased Interest 0  Down, Depressed, Hopeless 0  PHQ - 2 Score 0    Past Medical History:  Diagnosis Date  . Sleep apnea    History reviewed. No pertinent surgical history. Social History   Socioeconomic History  . Marital status: Married    Spouse name: Not on file  . Number of children: Not on file  . Years of education: Not on file  . Highest education level: Not on file  Occupational History  . Not on file  Tobacco Use  . Smoking status: Never Smoker  . Smokeless tobacco: Never Used  Substance and Sexual Activity  . Alcohol use: No  . Drug use: Never  . Sexual activity: Not on file  Other Topics Concern  . Not on file  Social History Narrative   ** Merged History Encounter **       Social Determinants of Health   Financial Resource Strain: Not on file  Food Insecurity: Not on file  Transportation Needs: Not on file  Physical Activity: Not on file  Stress: Not on file  Social Connections: Not on file  Intimate Partner Violence: Not on file   Family History  Problem Relation Age of Onset  . Cancer Mother   . Kidney cancer Neg Hx   . Kidney disease Neg Hx   . Prostate cancer Neg Hx  Current Outpatient Medications on File Prior to Visit  Medication Sig  . albuterol (VENTOLIN HFA) 108 (90 Base) MCG/ACT inhaler Inhale 2 puffs into the lungs every 6 (six) hours.  Marland Kitchen ascorbic acid (VITAMIN C) 500 MG tablet Take 1 tablet (500 mg total) by mouth daily.  Marland Kitchen aspirin EC 81 MG tablet Take 81 mg by mouth daily. Swallow whole.  . Cinnamon 500 MG capsule Take 500 mg by mouth in the morning and at bedtime.  . phentermine (ADIPEX-P) 37.5 MG tablet Take 37.5 mg by mouth at bedtime.  Marland Kitchen zinc sulfate 220 (50 Zn) MG capsule Take 1 capsule (220 mg total) by mouth daily.   No current facility-administered medications on file prior to visit.    Review of Systems  Constitutional: Negative  for activity change, appetite change, chills, diaphoresis, fatigue and fever.  HENT: Negative for congestion and hearing loss.   Eyes: Negative for visual disturbance.  Respiratory: Negative for cough, chest tightness, shortness of breath and wheezing.   Cardiovascular: Negative for chest pain, palpitations and leg swelling.  Gastrointestinal: Negative for abdominal pain, constipation, diarrhea, nausea and vomiting.  Endocrine: Negative for cold intolerance.  Genitourinary: Negative for dysuria, frequency and hematuria.  Musculoskeletal: Negative for arthralgias and neck pain.  Skin: Negative for rash.  Allergic/Immunologic: Negative for environmental allergies.  Neurological: Negative for dizziness, weakness, light-headedness, numbness and headaches.  Hematological: Negative for adenopathy.  Psychiatric/Behavioral: Negative for behavioral problems, dysphoric mood and sleep disturbance.   Per HPI unless specifically indicated above     Objective:    BP (!) 144/80 (BP Location: Left Arm, Cuff Size: Normal)   Pulse 87   Ht 5\' 9"  (1.753 m)   Wt 263 lb 6.4 oz (119.5 kg)   SpO2 98%   BMI 38.90 kg/m   Wt Readings from Last 3 Encounters:  09/06/20 263 lb 6.4 oz (119.5 kg)  06/15/20 259 lb 9.6 oz (117.8 kg)  03/30/20 250 lb (113.4 kg)    Physical Exam Vitals and nursing note reviewed.  Constitutional:      General: He is not in acute distress.    Appearance: He is well-developed and well-nourished. He is not diaphoretic.     Comments: Well-appearing, comfortable, cooperative  HENT:     Head: Normocephalic and atraumatic.     Mouth/Throat:     Mouth: Oropharynx is clear and moist.  Eyes:     General:        Right eye: No discharge.        Left eye: No discharge.     Extraocular Movements: EOM normal.     Conjunctiva/sclera: Conjunctivae normal.     Pupils: Pupils are equal, round, and reactive to light.  Neck:     Thyroid: No thyromegaly.  Cardiovascular:     Rate and  Rhythm: Normal rate and regular rhythm.     Pulses: Intact distal pulses.     Heart sounds: Normal heart sounds. No murmur heard.   Pulmonary:     Effort: Pulmonary effort is normal. No respiratory distress.     Breath sounds: Normal breath sounds. No wheezing or rales.  Abdominal:     General: Bowel sounds are normal. There is no distension.     Palpations: Abdomen is soft. There is no mass.     Tenderness: There is no abdominal tenderness.  Musculoskeletal:        General: No tenderness or edema. Normal range of motion.     Cervical back: Normal range of  motion and neck supple.     Comments: Upper / Lower Extremities: - Normal muscle tone, strength bilateral upper extremities 5/5, lower extremities 5/5  Lymphadenopathy:     Cervical: No cervical adenopathy.  Skin:    General: Skin is warm and dry.     Findings: No erythema or rash.  Neurological:     Mental Status: He is alert and oriented to person, place, and time.     Comments: Distal sensation intact to light touch all extremities  Psychiatric:        Mood and Affect: Mood and affect normal.        Behavior: Behavior normal.     Comments: Well groomed, good eye contact, normal speech and thoughts       Results for orders placed or performed in visit on 08/27/20  Hepatitis C antibody  Result Value Ref Range   Hepatitis C Ab NON-REACTIVE NON-REACTI   SIGNAL TO CUT-OFF 0.61 <1.00  TSH  Result Value Ref Range   TSH 5.62 (H) 0.40 - 4.50 mIU/L  PSA  Result Value Ref Range   PSA 0.56 < OR = 4.0 ng/mL  Lipid panel  Result Value Ref Range   Cholesterol 185 <200 mg/dL   HDL 40 > OR = 40 mg/dL   Triglycerides 409 <811 mg/dL   LDL Cholesterol (Calc) 119 (H) mg/dL (calc)   Total CHOL/HDL Ratio 4.6 <5.0 (calc)   Non-HDL Cholesterol (Calc) 145 (H) <130 mg/dL (calc)  COMPLETE METABOLIC PANEL WITH GFR  Result Value Ref Range   Glucose, Bld 109 (H) 65 - 99 mg/dL   BUN 14 7 - 25 mg/dL   Creat 9.14 7.82 - 9.56 mg/dL   GFR,  Est Non African American 64 > OR = 60 mL/min/1.64m2   GFR, Est African American 74 > OR = 60 mL/min/1.58m2   BUN/Creatinine Ratio NOT APPLICABLE 6 - 22 (calc)   Sodium 140 135 - 146 mmol/L   Potassium 4.3 3.5 - 5.3 mmol/L   Chloride 104 98 - 110 mmol/L   CO2 28 20 - 32 mmol/L   Calcium 8.9 8.6 - 10.3 mg/dL   Total Protein 6.5 6.1 - 8.1 g/dL   Albumin 4.3 3.6 - 5.1 g/dL   Globulin 2.2 1.9 - 3.7 g/dL (calc)   AG Ratio 2.0 1.0 - 2.5 (calc)   Total Bilirubin 0.4 0.2 - 1.2 mg/dL   Alkaline phosphatase (APISO) 69 35 - 144 U/L   AST 21 10 - 35 U/L   ALT 23 9 - 46 U/L  CBC with Differential/Platelet  Result Value Ref Range   WBC 5.5 3.8 - 10.8 Thousand/uL   RBC 5.23 4.20 - 5.80 Million/uL   Hemoglobin 14.7 13.2 - 17.1 g/dL   HCT 21.3 08.6 - 57.8 %   MCV 82.6 80.0 - 100.0 fL   MCH 28.1 27.0 - 33.0 pg   MCHC 34.0 32.0 - 36.0 g/dL   RDW 46.9 62.9 - 52.8 %   Platelets 349 140 - 400 Thousand/uL   MPV 10.0 7.5 - 12.5 fL   Neutro Abs 2,932 1,500 - 7,800 cells/uL   Lymphs Abs 1,881 850 - 3,900 cells/uL   Absolute Monocytes 468 200 - 950 cells/uL   Eosinophils Absolute 182 15 - 500 cells/uL   Basophils Absolute 39 0 - 200 cells/uL   Neutrophils Relative % 53.3 %   Total Lymphocyte 34.2 %   Monocytes Relative 8.5 %   Eosinophils Relative 3.3 %   Basophils Relative 0.7 %  Hemoglobin A1c  Result Value Ref Range   Hgb A1c MFr Bld 6.1 (H) <5.7 % of total Hgb   Mean Plasma Glucose 128 mg/dL   eAG (mmol/L) 7.1 mmol/L    I have personally reviewed the radiology report from 08/26/20 on C-spine and L-spine.  Narrative & Impression  CLINICAL DATA:  Chronic neck pain  EXAM: CERVICAL SPINE - COMPLETE 4+ VIEW  COMPARISON:  None.  FINDINGS: There is straightening of the cervical spine, possibly related to underlying muscular spasm. There is no acute fracture or listhesis of the cervical spine. Vertebral body height has been preserved. There is intervertebral disc space narrowing and  endplate remodeling of C3-C7, most severe at C5-6, in keeping with changes of mild to moderate degenerative disc disease. Oblique views demonstrate mild left neuroforaminal narrowing at C5-6 secondary to uncovertebral arthrosis. Remaining neural foramina are widely patent. Lateral masses of C1 are well aligned with the body of C2. The prevertebral soft tissues are not thickened. The spinal canal is widely patent.  IMPRESSION: Mild to moderate diffuse degenerative disc disease.  Left C5-6 neuroforaminal narrowing secondary to uncovertebral arthrosis.  Mild straightening of the cervical spine, possibly related to underlying muscular spasm.   Electronically Signed   By: Helyn Numbers MD   On: 08/26/2020 13:45    CLINICAL DATA:  Chronic low back pain  EXAM: LUMBAR SPINE - COMPLETE 4+ VIEW  COMPARISON:  None.  FINDINGS: Five view radiograph cervical spine demonstrates normal lumbar lordosis. No acute fracture or listhesis of the lumbar spine. Mild intervertebral disc space narrowing and endplate remodeling of L2-L5, most severe at L3-4, is in keeping with mild degenerative disc disease. Vertebral body heights and remaining intervertebral disc heights are preserved. The paraspinal soft tissues are unremarkable. Oblique views demonstrate no evidence of pars defect.  IMPRESSION: Mild degenerative disc disease.   Electronically Signed   By: Helyn Numbers MD   On: 08/26/2020 13:46     Assessment & Plan:   Problem List Items Addressed This Visit    Pre-diabetes    Improved A1c to 6.1 Concern with obesity, HTN, HLD  Plan:  1. Not on any therapy currently  2. Encourage improved lifestyle - low carb, low sugar diet, reduce portion size, continue improving regular exercise 3. Follow-up 6 month labs       Morbid obesity (HCC)    Encourage lifestyle diet exercise wt loss      Essential hypertension    Mildly elevated initial BP, repeat manual check  improved. No known complications    Plan:  1. Start Amlodipine  daily for blood pressure Keep track of BP, goal is < 140/90 If still elevated BP - call and we can order a  dosage Encourage improved lifestyle - low sodium diet, regular exercise  F/u within 6 month      Relevant Medications   aspirin EC 81 MG tablet   amLODipine (NORVASC) 5 MG tablet   DDD (degenerative disc disease), lumbar   Relevant Medications   aspirin EC 81 MG tablet   gabapentin (NEURONTIN) 100 MG capsule   DDD (degenerative disc disease), cervical   Relevant Medications   aspirin EC 81 MG tablet   gabapentin (NEURONTIN) 100 MG capsule   Chronic neck pain   Relevant Medications   aspirin EC 81 MG tablet   gabapentin (NEURONTIN) 100 MG capsule   Chronic bilateral low back pain without sciatica   Relevant Medications   aspirin EC 81 MG tablet   gabapentin (  NEURONTIN) 100 MG capsule    Other Visit Diagnoses    Annual physical exam    -  Primary   Screening for colon cancer       Relevant Orders   Cologuard      Updated Health Maintenance information  Due for routine colon cancer screening. Never had colonoscopy (not interested), no family history colon cancer. - Discussion today about recommendations for either Colonoscopy or Cologuard screening, benefits and risks of screening, interested in Cologuard, understands that if positive then recommendation is for diagnostic colonoscopy to follow-up. - Ordered Cologuard today  Reviewed recent lab results with patient Encouraged improvement to lifestyle with diet and exercise - Goal of weight loss  #Additionally Chronic Neck / Back Pain / DDD Discussed this problem as well Advised he can trial Gabapentin titration per AVS If unresolved or not improved, we can [facility]   Orders Placed This Encounter  Procedures  . Cologuard      Meds ordered this encounter  Medications  .  gabapentin (NEURONTIN) 100 MG capsule    Sig: Start 1 capsule daily, increase by 1 cap every 2-3 days as tolerated up to 3 times a day, or may take 3 at once in evening.    Dispense:  90 capsule    Refill:  1  . amLODipine (NORVASC) 5 MG tablet    Sig: Take 1 tablet (5 mg total) by mouth daily.    Dispense:  30 tablet    Refill:  5     Follow up plan: Return in about 6 months (around 03/06/2021) for 6 month fasting labs then 1 week later Follow-up HTN, PreDM, Thyroid.   Future labs ordered 02/28/21 A1c, BMET, Thyroid panel T4 TSH  Saralyn PilarAlexander Raelan Burgoon, DO [facility] 09/06/2020, 9:14 AM

## 2020-10-27 ENCOUNTER — Telehealth: Payer: Self-pay

## 2020-10-27 NOTE — Telephone Encounter (Signed)
I called his wife this morning after speaking with a rep at [employer]. They said that they never received an order so I let her know that I have faxed it again and they should be getting the kit in about a week or so.

## 2021-02-17 ENCOUNTER — Emergency Department: Payer: BC Managed Care – PPO

## 2021-02-17 ENCOUNTER — Other Ambulatory Visit: Payer: Self-pay

## 2021-02-17 ENCOUNTER — Encounter: Payer: Self-pay | Admitting: *Deleted

## 2021-02-17 DIAGNOSIS — R000 Tachycardia, unspecified: Secondary | ICD-10-CM | POA: Diagnosis not present

## 2021-02-17 DIAGNOSIS — Z7982 Long term (current) use of aspirin: Secondary | ICD-10-CM | POA: Insufficient documentation

## 2021-02-17 DIAGNOSIS — R0789 Other chest pain: Secondary | ICD-10-CM | POA: Diagnosis not present

## 2021-02-17 DIAGNOSIS — I10 Essential (primary) hypertension: Secondary | ICD-10-CM | POA: Diagnosis not present

## 2021-02-17 DIAGNOSIS — Z79899 Other long term (current) drug therapy: Secondary | ICD-10-CM | POA: Diagnosis not present

## 2021-02-17 DIAGNOSIS — R079 Chest pain, unspecified: Secondary | ICD-10-CM | POA: Diagnosis not present

## 2021-02-17 LAB — CBC
HCT: 38.3 % — ABNORMAL LOW (ref 39.0–52.0)
Hemoglobin: 13.3 g/dL (ref 13.0–17.0)
MCH: 29.3 pg (ref 26.0–34.0)
MCHC: 34.7 g/dL (ref 30.0–36.0)
MCV: 84.4 fL (ref 80.0–100.0)
Platelets: 315 10*3/uL (ref 150–400)
RBC: 4.54 MIL/uL (ref 4.22–5.81)
RDW: 12.7 % (ref 11.5–15.5)
WBC: 5.8 10*3/uL (ref 4.0–10.5)
nRBC: 0 % (ref 0.0–0.2)

## 2021-02-17 LAB — BASIC METABOLIC PANEL
Anion gap: 9 (ref 5–15)
BUN: 13 mg/dL (ref 6–20)
CO2: 26 mmol/L (ref 22–32)
Calcium: 8.6 mg/dL — ABNORMAL LOW (ref 8.9–10.3)
Chloride: 104 mmol/L (ref 98–111)
Creatinine, Ser: 1.25 mg/dL — ABNORMAL HIGH (ref 0.61–1.24)
GFR, Estimated: >60 mL/min (ref >60)
Glucose, Bld: 125 mg/dL — ABNORMAL HIGH (ref 70–99)
Potassium: 3.7 mmol/L (ref 3.5–5.1)
Sodium: 139 mmol/L (ref 135–145)

## 2021-02-17 LAB — TROPONIN I (HIGH SENSITIVITY): Troponin I (High Sensitivity): 9 ng/L (ref <18)

## 2021-02-17 NOTE — ED Triage Notes (Addendum)
Pt to ED via EMS from work reporting sudden onset of left sided chest pain radiating to his left shoulder. No SOB, no dizziness, No NVD. Pt is not diaphoretic. 5/10 throbbing pain.   324 mg of ASA administered with EMS.  Pt has hx of HTN and reports he has not been taking his medication for the past three days.

## 2021-02-18 ENCOUNTER — Emergency Department
Admission: EM | Admit: 2021-02-18 | Discharge: 2021-02-18 | Disposition: A | Discharge status: Home / Self Care | Payer: BC Managed Care – PPO | Attending: Emergency Medicine | Admitting: Emergency Medicine

## 2021-02-18 DIAGNOSIS — R0789 Other chest pain: Secondary | ICD-10-CM

## 2021-02-18 HISTORY — DX: Essential (primary) hypertension: I10

## 2021-02-18 LAB — TROPONIN I (HIGH SENSITIVITY): Troponin I (High Sensitivity): 11 ng/L (ref <18)

## 2021-02-18 MED ORDER — ASPIRIN EC 81 MG PO TBEC: 81.0000 mg | DELAYED_RELEASE_TABLET | Freq: Every day | ORAL | 3 refills | Status: AC

## 2021-02-18 NOTE — Discharge Instructions (Addendum)
START a low-dose baby aspirin (81 mg) daily  MAKE SURE You are taking your BP medications  Call your primary to set up follow-up soon

## 2021-02-18 NOTE — ED Provider Notes (Signed)
[facility] Emergency Department Provider Note  ____________________________________________   Event Date/Time   First MD Initiated Contact with Patient 02/18/21 [phone]     (approximate)  I have reviewed the triage vital signs and the nursing notes.   HISTORY  Chief Complaint Chest Pain    HPI Kyle Miles is a 50 y.o. male with history of obesity, prediabetes, hypertension, here with left-sided chest pain.  The patient states he was at work today.  However, he was just resting in the car at the time.  He states he developed a pressure-like but throbbing sensation in his left chest.  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  He does admit he has not been taking his blood pressure medication regularly.  Denies any current chest pain.  No shortness of breath.  No diaphoresis.  No nausea or vomiting.  He has had some intermittent chest pain off and on for several weeks, but has not necessarily noticed if this is related to exertion.  No other complaints.    Past Medical History:  Diagnosis Date   Hypertension    Sleep apnea     Patient Active Problem List   Diagnosis Date Noted   DDD (degenerative disc disease), cervical 09/06/2020   DDD (degenerative disc disease), lumbar 09/06/2020   OSA (obstructive sleep apnea) 06/15/2020   Morbid obesity (HCC) 06/15/2020   Essential hypertension 06/15/2020   Chronic bilateral low back pain without sciatica 06/15/2020   Chronic neck pain 06/15/2020   Elevated LFTs    Pre-diabetes    Hypogonadism in male 03/14/2016   Nocturia 03/14/2016    History reviewed. No pertinent surgical history.  Prior to Admission medications   Medication Sig Start Date End Date Taking? Authorizing Provider  aspirin EC 81 MG tablet Take 1 tablet (81 mg total) by mouth daily. Swallow whole. 02/18/21 02/18/22 Yes Shaune Pollack, MD  albuterol (VENTOLIN HFA) 108 (90  Base) MCG/ACT inhaler Inhale 2 puffs into the lungs every 6 (six) hours. 04/02/20   Alford Highland, MD  amLODipine (NORVASC) 5 MG tablet Take 1 tablet (5 mg total) by mouth daily. 09/06/20   Karamalegos, Netta Neat, DO  ascorbic acid (VITAMIN C) 500 MG tablet Take 1 tablet (500 mg total) by mouth daily. 04/03/20   Alford Highland, MD  Cinnamon 500 MG capsule Take 500 mg by mouth in the morning and at bedtime.    [provider]  gabapentin (NEURONTIN) 100 MG capsule Start 1 capsule daily, increase by 1 cap every 2-3 days as tolerated up to 3 times a day, or may take 3 at once in evening. 09/06/20   Althea Charon, Netta Neat, DO  phentermine (ADIPEX-P) 37.5 MG tablet Take 37.5 mg by mouth at bedtime. 05/26/20   [provider]  zinc sulfate 220 (50 Zn) MG capsule Take 1 capsule (220 mg total) by mouth daily. 04/03/20   Alford Highland, MD    Allergies Patient has no known allergies.  Family History  Problem Relation Age of Onset   Cancer Mother    Kidney cancer Neg Hx    Kidney disease Neg Hx    Prostate cancer Neg Hx     Social History Social History   Tobacco Use   Smoking status: Never   Smokeless tobacco: Never  Substance Use Topics   Alcohol use: No   Drug use: Never    Review of Systems  Review of Systems  Constitutional:  Negative for chills, fatigue and fever.  HENT:  Negative for sore throat.   Respiratory:  Positive for chest tightness. Negative for shortness of breath.   Cardiovascular:  Positive for chest pain.  Gastrointestinal:  Negative for abdominal pain.  Genitourinary:  Negative for flank pain.  Musculoskeletal:  Negative for neck pain.  Skin:  Negative for rash and wound.  Allergic/Immunologic: Negative for immunocompromised state.  Neurological:  Negative for weakness and numbness.  Hematological:  Does not bruise/bleed easily.  All other systems reviewed and are negative.   ____________________________________________  PHYSICAL  EXAM:      VITAL SIGNS: ED Triage Vitals  Enc Vitals Group     BP 02/17/21 2124 (!) 154/86     Pulse Rate 02/17/21 2124 86     Resp 02/18/21 0033 20     Temp 02/17/21 2124 98.6 F (37 C)     Temp Source 02/17/21 2124 Oral     SpO2 02/17/21 2124 98 %     Weight --      Height --      Head Circumference --      Peak Flow --      Pain Score --      Pain Loc --      Pain Edu? --      Excl. in GC? --      Physical Exam Vitals and nursing note reviewed.  Constitutional:      General: He is not in acute distress.    Appearance: He is well-developed.  HENT:     Head: Normocephalic and atraumatic.  Eyes:     Conjunctiva/sclera: Conjunctivae normal.  Cardiovascular:     Rate and Rhythm: Normal rate and regular rhythm.     Heart sounds: Normal heart sounds. No murmur heard.   No friction rub.  Pulmonary:     Effort: Pulmonary effort is normal. No respiratory distress.     Breath sounds: Normal breath sounds. No wheezing or rales.  Abdominal:     General: There is no distension.     Palpations: Abdomen is soft.     Tenderness: There is no abdominal tenderness.  Musculoskeletal:     Cervical back: Neck supple.  Skin:    General: Skin is warm.     Capillary Refill: Capillary refill takes less than 2 seconds.  Neurological:     Mental Status: He is alert and oriented to person, place, and time.     Motor: No abnormal muscle tone.      ____________________________________________   LABS (all labs ordered are listed, but only abnormal results are displayed)  Labs Reviewed  BASIC METABOLIC PANEL - Abnormal; Notable for the following components:      Result Value   Glucose, Bld 125 (*)    Creatinine, Ser 1.25 (*)    Calcium 8.6 (*)    All other components within normal limits  CBC - Abnormal; Notable for the following components:   HCT 38.3 (*)    All other components within normal limits  TROPONIN I (HIGH SENSITIVITY)  TROPONIN I (HIGH SENSITIVITY)     ____________________________________________  EKG: Normal sinus rhythm, ventricular rate 78.  PR 144, QRS 84, QTc 417.  No acute ST elevation or depression.  No EKG evidence of acute ischemia or infarct. ________________________________________  RADIOLOGY All imaging, including plain films, CT scans, and ultrasounds, independently reviewed by me, and interpretations confirmed via formal radiology reads.  ED MD interpretation:  Chest x-ray: Clear, no focal abnormality.  Official radiology report(s): DG Chest 2 View  Result Date: 02/17/2021 CLINICAL DATA:  Chest pain EXAM: CHEST - 2 VIEW COMPARISON:  03/30/2020 FINDINGS: The heart size and mediastinal contours are within normal limits. Both lungs are clear. The visualized skeletal structures are unremarkable. IMPRESSION: No active cardiopulmonary disease. Electronically Signed   By: Jasmine Pang M.D.   On: 02/17/2021 21:46    ____________________________________________  PROCEDURES   Procedure(s) performed (including Critical Care):  Procedures  ____________________________________________  INITIAL IMPRESSION / MDM / ASSESSMENT AND PLAN / ED COURSE  As part of my medical decision making, I reviewed the following data within the electronic MEDICAL RECORD NUMBER Nursing notes reviewed and incorporated, Old chart reviewed, Notes from prior ED visits, and [place] Controlled Substance Database       *Kyle Miles was evaluated in Emergency Department on 02/18/2021 for the symptoms described in the history of present illness. He was evaluated in the context of the global COVID-19 pandemic, which necessitated consideration that the patient might be at risk for infection with the SARS-CoV-2 virus that causes COVID-19. Institutional protocols and algorithms that pertain to the evaluation of patients at risk for COVID-19 are in a state of rapid change based on information released by regulatory bodies including the CDC and federal and state  organizations. These policies and algorithms were followed during the patient's care in the ED.  Some ED evaluations and interventions may be delayed as a result of limited staffing during the pandemic.*     Medical Decision Making: 50 year old male here with atypical chest pain, now resolved.  EKG is nonischemic.  Troponin is negative x2, making ACS unlikely.  Chest x-ray reviewed and is clear.  Lab work otherwise reassuring.  Pain is resolved.  Suspect atypical chest pain, unlikely to be ACS.  He does, however, have some moderate risk factors for this.  He is mildly hypertensive but has not been taking his blood pressure medications.  We discussed the importance of blood pressure medication adherence.  Given his risk factors, I do think it is reasonable to refer him to his PCP for possible outpatient stress testing and cardiology referral.  Also start him on a low-dose aspirin.  Otherwise, no apparent emergent pathology or signs of acute ACS.  ____________________________________________  FINAL CLINICAL IMPRESSION(S) / ED DIAGNOSES  Final diagnoses:  Atypical chest pain     MEDICATIONS GIVEN DURING THIS VISIT:  Medications - No data to display   ED Discharge Orders          Ordered    aspirin EC 81 MG tablet  Daily        02/18/21 0552             Note:  This document was prepared using Dragon voice recognition software and may include unintentional dictation errors.   Shaune Pollack, MD 02/18/21 [phone]

## 2021-02-22 ENCOUNTER — Encounter: Payer: Self-pay | Admitting: Family Medicine

## 2021-02-22 ENCOUNTER — Ambulatory Visit: Payer: BC Managed Care – PPO | Admitting: Family Medicine

## 2021-02-22 ENCOUNTER — Other Ambulatory Visit: Payer: Self-pay

## 2021-02-22 VITALS — BP 120/64 | HR 73 | Ht 69.0 in | Wt 271.6 lb

## 2021-02-22 DIAGNOSIS — M545 Low back pain, unspecified: Secondary | ICD-10-CM | POA: Diagnosis not present

## 2021-02-22 DIAGNOSIS — G8929 Other chronic pain: Secondary | ICD-10-CM

## 2021-02-22 DIAGNOSIS — M5136 Other intervertebral disc degeneration, lumbar region: Secondary | ICD-10-CM

## 2021-02-22 DIAGNOSIS — I10 Essential (primary) hypertension: Secondary | ICD-10-CM | POA: Diagnosis not present

## 2021-02-22 DIAGNOSIS — Z1211 Encounter for screening for malignant neoplasm of colon: Secondary | ICD-10-CM

## 2021-02-22 DIAGNOSIS — M62830 Muscle spasm of back: Secondary | ICD-10-CM

## 2021-02-22 DIAGNOSIS — R0789 Other chest pain: Secondary | ICD-10-CM

## 2021-02-22 MED ORDER — AMLODIPINE BESYLATE 5 MG PO TABS: 5.0000 mg | ORAL_TABLET | Freq: Every day | ORAL | 3 refills | Status: DC

## 2021-02-22 MED ORDER — BACLOFEN 10 MG PO TABS: 5.0000 mg | ORAL_TABLET | Freq: Two times a day (BID) | ORAL | 2 refills | Status: DC | PRN

## 2021-02-22 NOTE — Patient Instructions (Addendum)
Thank you for coming to the office today.  [facility] ([facility])  [facility] [address] [place], [place]  [address] Phone: [phone]  Re ordered Cologuard  Follow instructions to collect sample, you may call the company for any help or questions, 24/7 telephone support at [phone].  Let me know in 2 weeks on mychart or call the company  ---- Start taking Baclofen (Lioresal) 10mg  (muscle relaxant) - start with half (cut) to one whole pill at night as needed for next 1-3 nights (may make you drowsy, caution with driving) see how it affects you, then if tolerated increase to one pill 2 to 3 times a day or (every 8 hours as needed)  Recommend to start taking Tylenol Extra Strength 500mg  tabs - take 1 to 2 tabs per dose (max 1000mg ) every 6-8 hours for pain (take regularly, don't skip a dose for next 7 days), max 24 hour daily dose is 6 tablets or 3000mg . In the future you can repeat the same everyday Tylenol course for 1-2 weeks at a time.   Muscle rub and heating pad for back.  If not improving we can refer to Physical Therapy or Orthopedics.  Please schedule a Follow-up Appointment to: Return in about 29 days (around 03/23/2021) for as scheduled.  If you have any other questions or concerns, please feel free to call the office or send a message through MyChart. You may also schedule an earlier appointment if necessary.  Additionally, you may be receiving a survey about your experience at our office within a few days to 1 week by e-mail or mail. We value your feedback.  , DO [facility], 

## 2021-02-22 NOTE — Progress Notes (Signed)
Subjective:    Patient ID: Kyle Miles, male    DOB: Sep 14, 1970, 50 y.o.   MRN: [id number]  Kyle Miles is a 50 y.o. male presenting on 02/22/2021 for Hospitalization Follow-up and Back Pain   HPI  ED FOLLOW-UP VISIT  Hospital/Location: [facility] Date of ED Visit: 02/18/21  Reason for Presenting to ED: Chest Pain  FOLLOW-UP  - ED provider note and record have been reviewed - Patient presents today about 4 days weeks after recent ED visit. Brief summary of recent course, patient had symptoms of episodic throbbing chest pain for past 1 month intermittently, acute episode occurred while at work, presented to ED on 02/18/21, testing in ED with EKG, CXR and labs Troponin delta x 2 negative, treated with observation and discharge..  - Today reports overall has done well after discharge from ED. Symptoms of chest pain have resolved   He is interested in cardiologist consult now.  History of Phentermine but not taking regularly.  - New medications on discharge: None - Changes to current meds on discharge: None   I have reviewed the discharge medication list, and have reconciled the current and discharge medications today.  Acute on Chronic Low Back Pain Back muscle spasms History at work lifting heavy parts, bending and lifting. Last 1 week with more training and less physical exertion lifting. - He has still symptoms of low back pain and spasm. Not radiating into legs or causing any other symptoms Denies any numbness tingling weakness.    Family History  Problem Relation Age of Onset   Cancer Mother    Heart disease Father 23   Kidney cancer Neg Hx    Kidney disease Neg Hx    Prostate cancer Neg Hx      Depression screen PHQ 2/9 06/15/2020  Decreased Interest 0  Down, Depressed, Hopeless 0  PHQ - 2 Score 0    Social History   Tobacco Use   Smoking status: Never   Smokeless tobacco: Never  Substance Use Topics   Alcohol use: No   Drug use: Never    Review  of Systems Per HPI unless specifically indicated above     Objective:    BP 120/64   Pulse 73   Ht '5\' 9"'  (1.753 m)   Wt 271 lb 9.6 oz (123.2 kg)   SpO2 98%   BMI 40.11 kg/m   Wt Readings from Last 3 Encounters:  02/22/21 271 lb 9.6 oz (123.2 kg)  09/06/20 263 lb 6.4 oz (119.5 kg)  06/15/20 259 lb 9.6 oz (117.8 kg)    Physical Exam Vitals and nursing note reviewed.  Constitutional:      General: He is not in acute distress.    Appearance: He is well-developed. He is obese. He is not diaphoretic.     Comments: Well-appearing, comfortable, cooperative  HENT:     Head: Normocephalic and atraumatic.  Eyes:     General:        Right eye: No discharge.        Left eye: No discharge.     Conjunctiva/sclera: Conjunctivae normal.  Neck:     Thyroid: No thyromegaly.  Cardiovascular:     Rate and Rhythm: Normal rate and regular rhythm.     Pulses: Normal pulses.     Heart sounds: Normal heart sounds. No murmur heard. Pulmonary:     Effort: Pulmonary effort is normal. No respiratory distress.     Breath sounds: Normal breath sounds. No wheezing or  rales.  Musculoskeletal:        General: Normal range of motion.     Cervical back: Normal range of motion and neck supple.  Lymphadenopathy:     Cervical: No cervical adenopathy.  Skin:    General: Skin is warm and dry.     Findings: No erythema or rash.  Neurological:     Mental Status: He is alert and oriented to person, place, and time. Mental status is at baseline.  Psychiatric:        Behavior: Behavior normal.     Comments: Well groomed, good eye contact, normal speech and thoughts    I have personally reviewed the radiology report from 02/18/21 CXR.  DG Chest 2 ViewPerformed 02/17/2021 Final result  Study Result CLINICAL DATA: Chest pain  EXAM: CHEST - 2 VIEW  COMPARISON: 03/30/2020  FINDINGS: The heart size and mediastinal contours are within normal limits. Both lungs are clear. The visualized skeletal structures  are unremarkable.  IMPRESSION: No active cardiopulmonary disease.   Electronically Signed By: Donavan Foil M.D. On: 02/17/2021 21:46  -------------------------------------------------------  DG Lumbar Spine CompletePerformed 08/26/2020 Final result  Study Result CLINICAL DATA: Chronic low back pain  EXAM: LUMBAR SPINE - COMPLETE 4+ VIEW  COMPARISON: None.  FINDINGS: Five view radiograph cervical spine demonstrates normal lumbar lordosis. No acute fracture or listhesis of the lumbar spine. Mild intervertebral disc space narrowing and endplate remodeling of P8-E4, most severe at L3-4, is in keeping with mild degenerative disc disease. Vertebral body heights and remaining intervertebral disc heights are preserved. The paraspinal soft tissues are unremarkable. Oblique views demonstrate no evidence of pars defect.  IMPRESSION: Mild degenerative disc disease.   Electronically Signed By: Fidela Salisbury MD On: 08/26/2020 13:46    Results for orders placed or performed during the hospital encounter of 23/53/61  Basic metabolic panel  Result Value Ref Range   Sodium 139 135 - 145 mmol/L   Potassium 3.7 3.5 - 5.1 mmol/L   Chloride 104 98 - 111 mmol/L   CO2 26 22 - 32 mmol/L   Glucose, Bld 125 (H) 70 - 99 mg/dL   BUN 13 6 - 20 mg/dL   Creatinine, Ser 1.25 (H) 0.61 - 1.24 mg/dL   Calcium 8.6 (L) 8.9 - 10.3 mg/dL   GFR, Estimated >60 >60 mL/min   Anion gap 9 5 - 15  CBC  Result Value Ref Range   WBC 5.8 4.0 - 10.5 K/uL   RBC 4.54 4.22 - 5.81 MIL/uL   Hemoglobin 13.3 13.0 - 17.0 g/dL   HCT 38.3 (L) 39.0 - 52.0 %   MCV 84.4 80.0 - 100.0 fL   MCH 29.3 26.0 - 34.0 pg   MCHC 34.7 30.0 - 36.0 g/dL   RDW 12.7 11.5 - 15.5 %   Platelets 315 150 - 400 K/uL   nRBC 0.0 0.0 - 0.2 %  Troponin I (High Sensitivity)  Result Value Ref Range   Troponin I (High Sensitivity) 9 <18 ng/L  Troponin I (High Sensitivity)  Result Value Ref Range   Troponin I (High Sensitivity) 11 <18  ng/L      Assessment & Plan:   Problem List Items Addressed This Visit     Essential hypertension   Relevant Medications   amLODipine (NORVASC) 5 MG tablet   Other Relevant Orders   Ambulatory referral to Cardiology   DDD (degenerative disc disease), lumbar   Relevant Medications   baclofen (LIORESAL) 10 MG tablet   Chronic bilateral low back  pain without sciatica   Relevant Medications   baclofen (LIORESAL) 10 MG tablet   Other Visit Diagnoses     Atypical chest pain    -  Primary   Relevant Orders   Ambulatory referral to Cardiology   Screening for colon cancer       Relevant Orders   Cologuard   Back spasm           #Atypical Chest Pain ASCVD risk factors - morbid obesity, HTN HLD Fam history of early heart disease  History of episodic chest pain pressure atypical features for 1 month ED visit reviewed note, EKG, CXR, Troponin delta negative  Referral to [facility] for consultation and further testing including maybe stress test and other eval.  #Back Pain  Acute on chronic R LBP without associated. Suspect likely due to muscle spasm/strain, without known injury or trauma. But has repetitive heavy lifting strain on back at work. - No red flag symptoms. Negative SLR for radiculopathy - Inadequate conservative therapy  Last image 2021 mild lumbar DJD  Plan: 1. Start muscle relaxant with Baclofen 22m tabs - take 5-169mup to BID PRN, titrate up as tolerated caution sedation 2. May use Tylenol PRN for breakthrough 3. Encouraged use of heating pad 1-2x daily for now then PRN Follow-up 4-6 weeks if not improved for re-evaluation, consider X-ray imaging, trial of PT, and possibly referral to Orthopedic   Orders Placed This Encounter  Procedures   Cologuard    Patient never received last kit.   Ambulatory referral to Cardiology    Referral Priority:   Routine    Referral Type:   Consultation    Referral Reason:   Specialty Services Required     Requested Specialty:   Cardiology    Number of Visits Requested:   1     Meds ordered this encounter  Medications   amLODipine (NORVASC) 5 MG tablet    Sig: Take 1 tablet (5 mg total) by mouth daily.    Dispense:  90 tablet    Refill:  3   baclofen (LIORESAL) 10 MG tablet    Sig: Take 0.5-1 tablets (5-10 mg total) by mouth 2 (two) times daily as needed for muscle spasms.    Dispense:  30 each    Refill:  2      Follow up plan: Return in about 29 days (around 03/23/2021) for as scheduled.   AlNobie PutnamDOCalypsoroup 02/22/2021, 8:12 AM

## 2021-02-28 ENCOUNTER — Other Ambulatory Visit: Payer: BC Managed Care – PPO

## 2021-03-07 ENCOUNTER — Ambulatory Visit: Payer: BC Managed Care – PPO | Admitting: Family Medicine

## 2021-03-22 ENCOUNTER — Other Ambulatory Visit: Payer: Self-pay

## 2021-03-22 DIAGNOSIS — R7303 Prediabetes: Secondary | ICD-10-CM

## 2021-03-22 DIAGNOSIS — I10 Essential (primary) hypertension: Secondary | ICD-10-CM

## 2021-03-22 DIAGNOSIS — R7989 Other specified abnormal findings of blood chemistry: Secondary | ICD-10-CM

## 2021-03-23 ENCOUNTER — Other Ambulatory Visit: Payer: BC Managed Care – PPO

## 2021-03-23 DIAGNOSIS — I10 Essential (primary) hypertension: Secondary | ICD-10-CM | POA: Diagnosis not present

## 2021-03-23 DIAGNOSIS — R7989 Other specified abnormal findings of blood chemistry: Secondary | ICD-10-CM | POA: Diagnosis not present

## 2021-03-23 DIAGNOSIS — R7303 Prediabetes: Secondary | ICD-10-CM | POA: Diagnosis not present

## 2021-03-24 LAB — BASIC METABOLIC PANEL WITH GFR
BUN: 14 mg/dL (ref 7–25)
CO2: 28 mmol/L (ref 20–32)
Calcium: 9.1 mg/dL (ref 8.6–10.3)
Chloride: 104 mmol/L (ref 98–110)
Creat: 1.16 mg/dL (ref 0.70–1.30)
Glucose, Bld: 115 mg/dL — ABNORMAL HIGH (ref 65–99)
Potassium: 4.2 mmol/L (ref 3.5–5.3)
Sodium: 140 mmol/L (ref 135–146)
eGFR: 76 mL/min/{1.73_m2} (ref >=60)

## 2021-03-24 LAB — T4, FREE: Free T4: 1 ng/dL (ref 0.8–1.8)

## 2021-03-24 LAB — TSH: TSH: 6.14 mIU/L — ABNORMAL HIGH (ref 0.40–4.50)

## 2021-03-24 LAB — HEMOGLOBIN A1C
Hgb A1c MFr Bld: 6 % of total Hgb — ABNORMAL HIGH (ref <5.7)
Mean Plasma Glucose: 126 mg/dL
eAG (mmol/L): 7 mmol/L

## 2021-03-28 ENCOUNTER — Encounter: Payer: Self-pay | Admitting: Family Medicine

## 2021-03-28 ENCOUNTER — Other Ambulatory Visit: Payer: Self-pay

## 2021-03-28 ENCOUNTER — Ambulatory Visit: Payer: BC Managed Care – PPO | Admitting: Family Medicine

## 2021-03-28 ENCOUNTER — Other Ambulatory Visit: Payer: Self-pay | Admitting: Family Medicine

## 2021-03-28 VITALS — BP 132/77 | HR 72 | Ht 69.0 in | Wt 272.0 lb

## 2021-03-28 DIAGNOSIS — I10 Essential (primary) hypertension: Secondary | ICD-10-CM

## 2021-03-28 DIAGNOSIS — G8929 Other chronic pain: Secondary | ICD-10-CM

## 2021-03-28 DIAGNOSIS — M545 Low back pain, unspecified: Secondary | ICD-10-CM | POA: Diagnosis not present

## 2021-03-28 DIAGNOSIS — R7303 Prediabetes: Secondary | ICD-10-CM

## 2021-03-28 DIAGNOSIS — M5136 Other intervertebral disc degeneration, lumbar region: Secondary | ICD-10-CM

## 2021-03-28 DIAGNOSIS — R7989 Other specified abnormal findings of blood chemistry: Secondary | ICD-10-CM

## 2021-03-28 DIAGNOSIS — Z0000 Encounter for general adult medical examination without abnormal findings: Secondary | ICD-10-CM

## 2021-03-28 DIAGNOSIS — Z125 Encounter for screening for malignant neoplasm of prostate: Secondary | ICD-10-CM

## 2021-03-28 MED ORDER — MELOXICAM 15 MG PO TABS: 15.0000 mg | ORAL_TABLET | Freq: Every day | ORAL | 2 refills | Status: DC | PRN

## 2021-03-28 MED ORDER — TIZANIDINE HCL 4 MG PO TABS: 4.0000 mg | ORAL_TABLET | Freq: Three times a day (TID) | ORAL | 1 refills | Status: DC | PRN

## 2021-03-28 NOTE — Progress Notes (Signed)
Subjective:    Patient ID: Kyle Miles, male    DOB: Jun 12, 1971, 50 y.o.   MRN: [id number]  Kyle Miles is a 50 y.o. male presenting on 03/28/2021 for No chief complaint on file.   HPI  OSA, not on CPAP Morbid Obesity BMI >40 Previous history of snoring and his prior PCP few years ago tested for Sleep Apnea with PSG and confirmed. No longer using CPAP by his preference - He has followed with [facility] in [place] - he was treated with Phentermine and hormone therapy.   Pre-Diabetes Last lab result A1c 6.0% improved, still in range of PreDM.   CHRONIC HTN: Reports no new concerns. Recent dx HTN Current Meds - Amlodipine 75m daily   Reports good compliance, took meds today. Tolerating well, w/o complaints. 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 He has episodic sharp pain with movements. Not daily, has low back pain as well very similar seems to radiate in his spine. - Recent X-rays C-spine and L-spine done 08/2020 - He has chronic pain in neck and pain, intermittently, some days mild other days more severe  last visit given rx Baclofen, limited results Has failed Gabapentin, Flexeril, Baclofen Still has low back pain flare ups, prior x-ray results reviewed.   Low Testosterone Previously seen by Urologist in 2017, for fatigue Testing at that time, showed some low testosterone, considered treatment but never followed up on it. Due for repeat test   Elevated TSH Mild elevated up to 5.62, prior result was normal 2017. Now repeat result >6 on TSH but normal Free T4 1.0 Some symptoms of fatigue hard to attribute to thyroid only. no fam history.      Health Maintenance:  Due  colon CA Screening, interested in Cologuard. He has the kit at home now but has not pursued.   Depression screen PHQ 2/9 06/15/2020  Decreased Interest 0  Down, Depressed, Hopeless 0  PHQ - 2 Score 0    Social History   Tobacco Use   Smoking status: Never   Smokeless tobacco: Never  Substance Use Topics   Alcohol use: No   Drug use: Never    Review of Systems Per HPI unless specifically indicated above     Objective:    BP 132/77   Pulse 72   Ht _0  (1.753 m)   Wt 272 lb (123.4 kg)   SpO2 97%   BMI 40.17 kg/m   Wt Readings from Last 3 Encounters:  03/28/21 272 lb (123.4 kg)  02/22/21 271 lb 9.6 oz (123.2 kg)  09/06/20 263 lb 6.4 oz (119.5 kg)    Physical Exam Vitals and nursing note reviewed.  Constitutional:      General: He is not in acute distress.    Appearance: He is well-developed. He is not diaphoretic.     Comments: Well-appearing, comfortable, cooperative  HENT:     Head: Normocephalic and atraumatic.  Eyes:     General:        Right eye: No discharge.        Left eye: No discharge.     Conjunctiva/sclera: Conjunctivae normal.     Pupils: Pupils are  equal, round, and reactive to light.  Neck:     Thyroid: No thyromegaly.  Cardiovascular:     Rate and Rhythm: Normal rate and regular rhythm.     Pulses: Normal pulses.     Heart sounds: Normal heart sounds. No murmur heard. Pulmonary:     Effort: Pulmonary effort is normal. No respiratory distress.     Breath sounds: Normal breath sounds. No wheezing or rales.  Abdominal:     General: Bowel sounds are normal. There is no distension.     Palpations: Abdomen is soft. There is no mass.     Tenderness: There is no abdominal tenderness.  Musculoskeletal:        General: No tenderness. Normal range of motion.     Cervical back: Normal range of motion and neck supple.     Right lower leg: No edema.     Left lower leg: No edema.     Comments: Upper / Lower Extremities: - Normal muscle tone,  strength bilateral upper extremities 5/5, lower extremities 5/5  Lymphadenopathy:     Cervical: No cervical adenopathy.  Skin:    General: Skin is warm and dry.     Findings: No erythema or rash.  Neurological:     Mental Status: He is alert and oriented to person, place, and time.     Comments: Distal sensation intact to light touch all extremities  Psychiatric:        Mood and Affect: Mood normal.        Behavior: Behavior normal.        Thought Content: Thought content normal.     Comments: Well groomed, good eye contact, normal speech and thoughts     Results for orders placed or performed in visit on 40/97/35  BASIC METABOLIC PANEL WITH GFR  Result Value Ref Range   Glucose, Bld 115 (H) 65 - 99 mg/dL   BUN 14 7 - 25 mg/dL   Creat 1.16 0.70 - 1.30 mg/dL   eGFR 76 > OR = 60 mL/min/1.42m   BUN/Creatinine Ratio NOT APPLICABLE 6 - 22 (calc)   Sodium 140 135 - 146 mmol/L   Potassium 4.2 3.5 - 5.3 mmol/L   Chloride 104 98 - 110 mmol/L   CO2 28 20 - 32 mmol/L   Calcium 9.1 8.6 - 10.3 mg/dL  Hemoglobin A1c  Result Value Ref Range   Hgb A1c MFr Bld 6.0 (H) <5.7 % of total Hgb   Mean Plasma Glucose 126 mg/dL   eAG (mmol/L) 7.0 mmol/L  T4, free  Result Value Ref Range   Free T4 1.0 0.8 - 1.8 ng/dL  TSH  Result Value Ref Range   TSH 6.14 (H) 0.40 - 4.50 mIU/L      Assessment & Plan:   Problem List Items Addressed This Visit     Pre-diabetes - Primary   Essential hypertension   DDD (degenerative disc disease), lumbar   Relevant Medications   meloxicam (MOBIC) 15 MG tablet   tiZANidine (ZANAFLEX) 4 MG tablet   Chronic bilateral low back pain without sciatica   Relevant Medications   meloxicam (MOBIC) 15 MG tablet   tiZANidine (ZANAFLEX) 4 MG tablet   Other Visit Diagnoses     Elevated TSH           History of atypical chest pain Due for Cardiac work up as discussed last visit 02[facility]2021, [facility]  [facility]. Today advise  him to Call [facility] to schedule.  Elevated TSH Normal T4, slightly higher TSH now >6 will repeat in next visit 5 months and treat if still abnormal range and symptomatic, or monitor T4 longer.  Low testosterone Will repeat test in 08/2021 AM lab for testosterone, may be source of fatigue  Chronic back pain Reviewed prior X-ray Known DJD DDD lumbar spine Failed several therapies in past Trial on Tizanidine PRN caution sedation and Meloxicam PRN use intermittently limit NSAID Follow up in future consider Ortho  preDM improved A1c 6.0% Encourage lifestyle  HTN Controlled on current med Amlodipine.  Meds ordered this encounter  Medications   meloxicam (MOBIC) 15 MG tablet    Sig: Take 1 tablet (15 mg total) by mouth daily as needed for pain (back pain). May take for 1-2 week, then stop and take intermittently    Dispense:  30 tablet    Refill:  2   tiZANidine (ZANAFLEX) 4 MG tablet    Sig: Take 1 tablet (4 mg total) by mouth every 8 (eight) hours as needed for muscle spasms.    Dispense:  90 tablet    Refill:  1     Follow up plan: Return in about 5 months (around 08/28/2021) for 5 month fasting lab only then 1 week later Annual Physical.  Future labs ordered for  Feb 2023 Annual check TSH + T4 treat if still abnormal, Testosterone   Nobie Putnam, DO [facility] 03/28/2021, 8:57 AM

## 2021-03-28 NOTE — Patient Instructions (Addendum)
Thank you for coming to the office today.  Try to call [facility] to see if they can get you scheduled. It looks like the referral was processed back on 02/23/21 and waiting on scheduling  [facility] - Cardiology [address] [place], [place]  [address] Phone: [phone]  For back pain  Try Tizanidine as needed muscle relaxant  Also for flare ups only pretty much take Meloxicam 15mg  daily for 1-2 weeks then off for 1-2 weeks, avoid long term use and avoid use with ibuprofen aleve advil. Caution kidney function.  Next labs will re check Thyroid and Testosterone as well, and may end up treating based on these.  Recent Labs    04/01/20 0524 08/30/20 0835 03/23/21 0936  HGBA1C 6.2* 6.1* 6.0*    Sugar is improved now.  DUE for FASTING BLOOD WORK (no food or drink after midnight before the lab appointment, only water or coffee without cream/sugar on the morning of)  SCHEDULE "Lab Only" visit in the morning at the clinic for lab draw in 5 MONTHS   - Make sure Lab Only appointment is at about 1 week before your next appointment, so that results will be available  For Lab Results, once available within 2-3 days of blood draw, you can can log in to MyChart online to view your results and a brief explanation. Also, we can discuss results at next follow-up visit.    Please schedule a Follow-up Appointment to: Return in about 5 months (around 08/28/2021) for 5 month fasting lab only then 1 week later Annual Physical.  If you have any other questions or concerns, please feel free to call the office or send a message through MyChart. You may also schedule an earlier appointment if necessary.  Additionally, you may be receiving a survey about your experience at our office within a few days to 1 week by e-mail or mail. We value your feedback.  10/26/2021, DO [facility], [facility]

## 2021-06-06 DIAGNOSIS — I493 Ventricular premature depolarization: Secondary | ICD-10-CM | POA: Diagnosis not present

## 2021-06-06 DIAGNOSIS — Z8249 Family history of ischemic heart disease and other diseases of the circulatory system: Secondary | ICD-10-CM | POA: Diagnosis not present

## 2021-06-06 DIAGNOSIS — I10 Essential (primary) hypertension: Secondary | ICD-10-CM | POA: Diagnosis not present

## 2021-06-06 DIAGNOSIS — R0789 Other chest pain: Secondary | ICD-10-CM | POA: Diagnosis not present

## 2021-06-27 DIAGNOSIS — R0789 Other chest pain: Secondary | ICD-10-CM | POA: Diagnosis not present

## 2021-06-27 DIAGNOSIS — I493 Ventricular premature depolarization: Secondary | ICD-10-CM | POA: Diagnosis not present

## 2021-06-27 IMAGING — DX DG LUMBAR SPINE COMPLETE 4+V
5 series · 5 of 5 positions shown · non-contrast
Comparison: None.

CLINICAL DATA: Chronic low back pain

EXAM:
LUMBAR SPINE - COMPLETE 4+ VIEW

[l-spine ap]
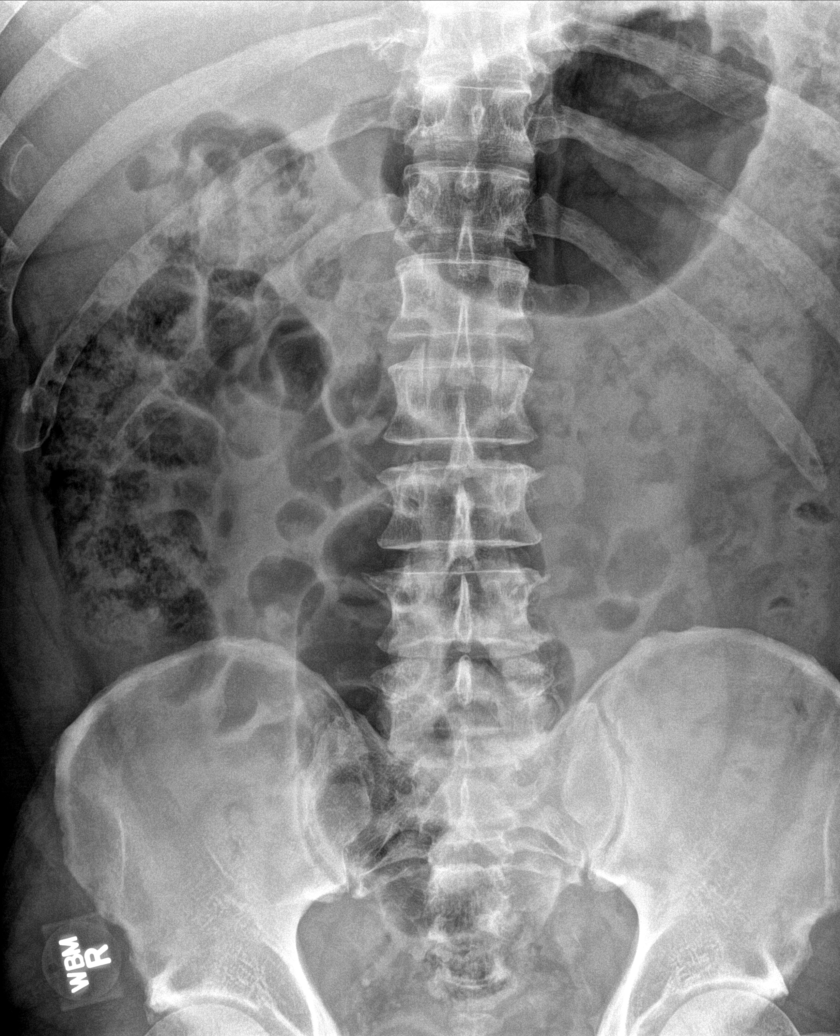

[l-spine obl (1 of 2)]
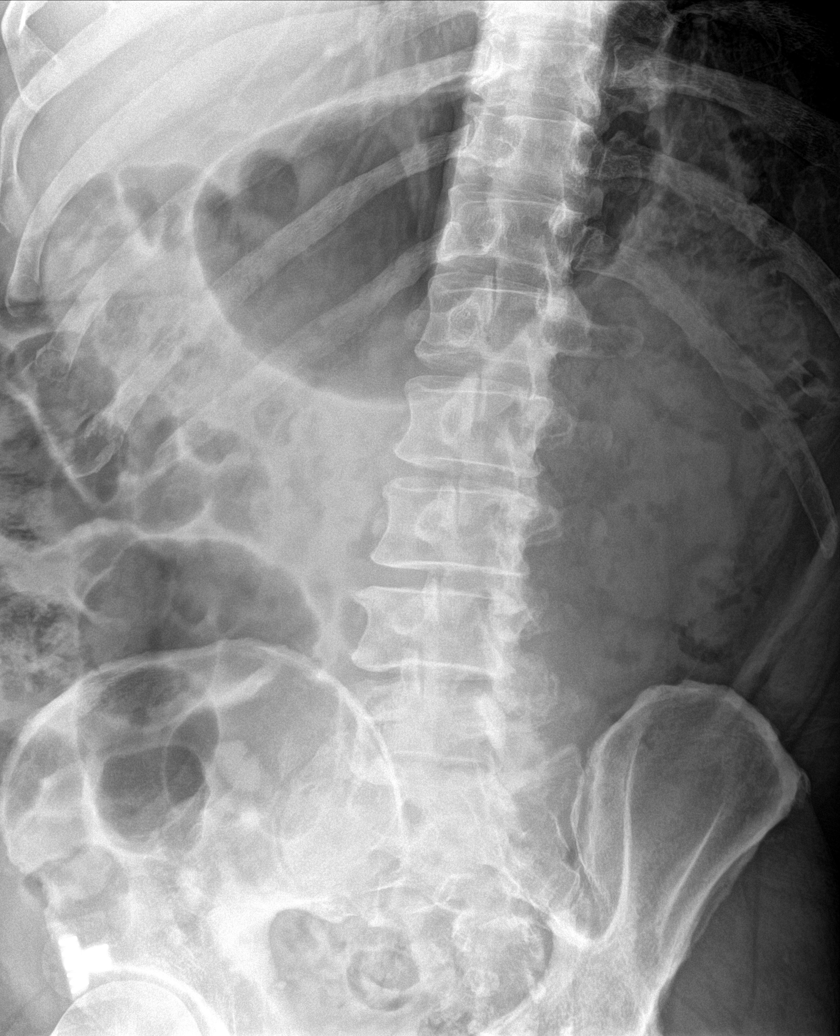

[l-spine obl (2 of 2)]
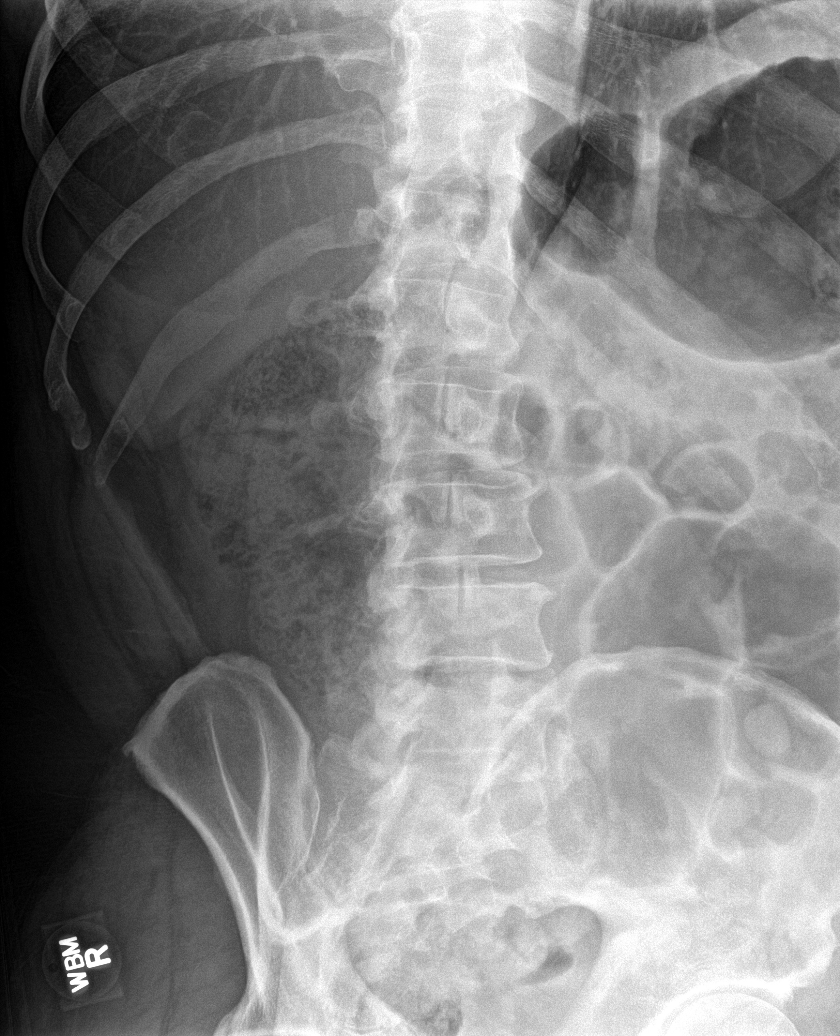

[l-spine lat]
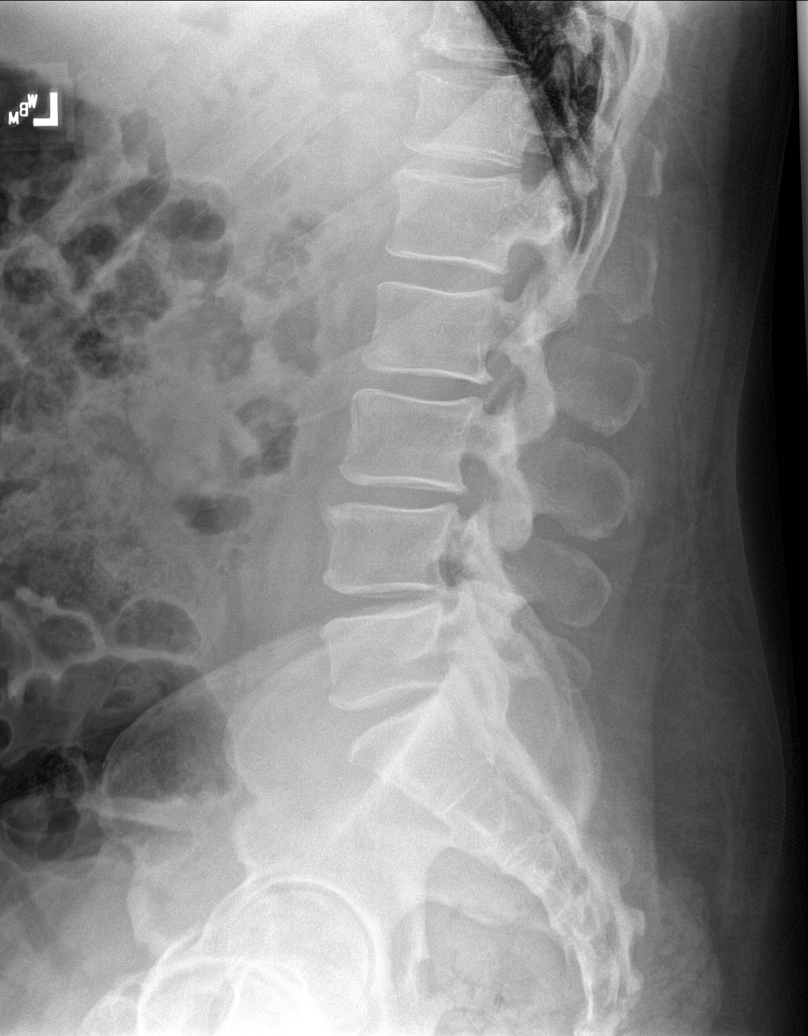

[l-spine spot]
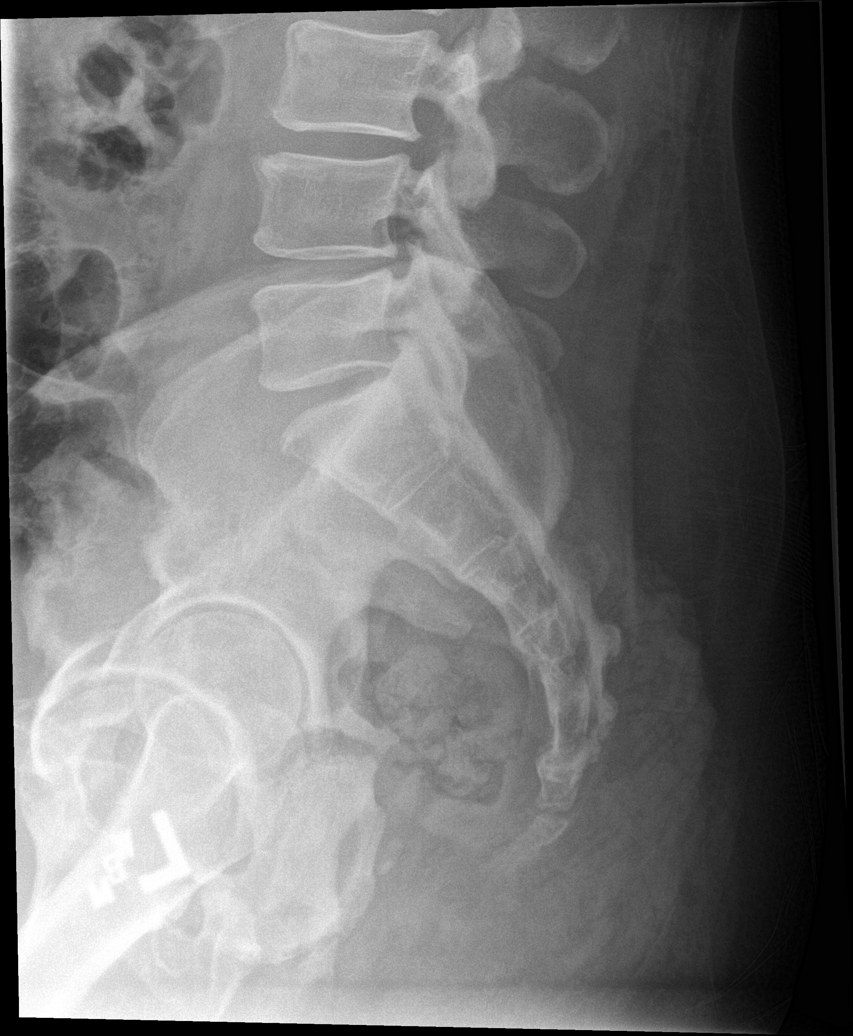

[5 of 5 positions shown; findings below may reference images not displayed]

FINDINGS: Five view radiograph cervical spine demonstrates normal lumbar
lordosis. No acute fracture or listhesis of the lumbar spine. Mild
intervertebral disc space narrowing and endplate remodeling of
L2-L5, most severe at L3-4, is in keeping with mild degenerative
disc disease. Vertebral body heights and remaining intervertebral
disc heights are preserved. The paraspinal soft tissues are
unremarkable. Oblique views demonstrate no evidence of pars defect.
IMPRESSION: Mild degenerative disc disease.

## 2021-08-23 ENCOUNTER — Other Ambulatory Visit: Payer: BC Managed Care – PPO

## 2021-08-23 DIAGNOSIS — Z125 Encounter for screening for malignant neoplasm of prostate: Secondary | ICD-10-CM | POA: Diagnosis not present

## 2021-08-23 DIAGNOSIS — Z0000 Encounter for general adult medical examination without abnormal findings: Secondary | ICD-10-CM

## 2021-08-23 DIAGNOSIS — I10 Essential (primary) hypertension: Secondary | ICD-10-CM | POA: Diagnosis not present

## 2021-08-23 DIAGNOSIS — R7303 Prediabetes: Secondary | ICD-10-CM | POA: Diagnosis not present

## 2021-08-23 DIAGNOSIS — R7989 Other specified abnormal findings of blood chemistry: Secondary | ICD-10-CM | POA: Diagnosis not present

## 2021-08-24 LAB — COMPLETE METABOLIC PANEL WITH GFR
AG Ratio: 1.5 (calc) (ref 1.0–2.5)
ALT: 26 U/L (ref 9–46)
AST: 22 U/L (ref 10–35)
Albumin: 4.3 g/dL (ref 3.6–5.1)
Alkaline phosphatase (APISO): 72 U/L (ref 35–144)
BUN: 21 mg/dL (ref 7–25)
CO2: 29 mmol/L (ref 20–32)
Calcium: 9.2 mg/dL (ref 8.6–10.3)
Chloride: 103 mmol/L (ref 98–110)
Creat: 1.25 mg/dL (ref 0.70–1.30)
Globulin: 2.9 g/dL (calc) (ref 1.9–3.7)
Glucose, Bld: 120 mg/dL — ABNORMAL HIGH (ref 65–99)
Potassium: 4.2 mmol/L (ref 3.5–5.3)
Sodium: 140 mmol/L (ref 135–146)
Total Bilirubin: 0.7 mg/dL (ref 0.2–1.2)
Total Protein: 7.2 g/dL (ref 6.1–8.1)
eGFR: 69 mL/min/{1.73_m2} (ref >=60)

## 2021-08-24 LAB — CBC WITH DIFFERENTIAL/PLATELET
Absolute Monocytes: 479 cells/uL (ref 200–950)
Basophils Absolute: 17 cells/uL (ref 0–200)
Basophils Relative: 0.3 %
Eosinophils Absolute: 137 cells/uL (ref 15–500)
Eosinophils Relative: 2.4 %
HCT: 43.6 % (ref 38.5–50.0)
Hemoglobin: 14.6 g/dL (ref 13.2–17.1)
Lymphs Abs: 1562 cells/uL (ref 850–3900)
MCH: 28.5 pg (ref 27.0–33.0)
MCHC: 33.5 g/dL (ref 32.0–36.0)
MCV: 85 fL (ref 80.0–100.0)
MPV: 9.7 fL (ref 7.5–12.5)
Monocytes Relative: 8.4 %
Neutro Abs: 3506 cells/uL (ref 1500–7800)
Neutrophils Relative %: 61.5 %
Platelets: 323 10*3/uL (ref 140–400)
RBC: 5.13 10*6/uL (ref 4.20–5.80)
RDW: 13.1 % (ref 11.0–15.0)
Total Lymphocyte: 27.4 %
WBC: 5.7 10*3/uL (ref 3.8–10.8)

## 2021-08-24 LAB — LIPID PANEL
Cholesterol: 196 mg/dL (ref <200)
HDL: 45 mg/dL (ref >=40)
LDL Cholesterol (Calc): 129 mg/dL (calc) — ABNORMAL HIGH
Non-HDL Cholesterol (Calc): 151 mg/dL (calc) — ABNORMAL HIGH (ref <130)
Total CHOL/HDL Ratio: 4.4 (calc) (ref <5.0)
Triglycerides: 112 mg/dL (ref <150)

## 2021-08-24 LAB — PSA: PSA: 0.55 ng/mL (ref <=4.00)

## 2021-08-24 LAB — T4, FREE: Free T4: 0.9 ng/dL (ref 0.8–1.8)

## 2021-08-24 LAB — TSH: TSH: 5.31 mIU/L — ABNORMAL HIGH (ref 0.40–4.50)

## 2021-08-24 LAB — HEMOGLOBIN A1C
Hgb A1c MFr Bld: 6.1 % of total Hgb — ABNORMAL HIGH (ref <5.7)
Mean Plasma Glucose: 128 mg/dL
eAG (mmol/L): 7.1 mmol/L

## 2021-08-24 LAB — TESTOSTERONE: Testosterone: 230 ng/dL — ABNORMAL LOW (ref 250–827)

## 2021-08-30 ENCOUNTER — Encounter: Payer: Self-pay | Admitting: Family Medicine

## 2021-08-30 ENCOUNTER — Other Ambulatory Visit: Payer: Self-pay

## 2021-08-30 ENCOUNTER — Ambulatory Visit (INDEPENDENT_AMBULATORY_CARE_PROVIDER_SITE_OTHER): Payer: BC Managed Care – PPO | Admitting: Family Medicine

## 2021-08-30 VITALS — BP 122/82 | HR 76 | Resp 18 | Ht 69.0 in | Wt 279.4 lb

## 2021-08-30 DIAGNOSIS — R7303 Prediabetes: Secondary | ICD-10-CM

## 2021-08-30 DIAGNOSIS — E291 Testicular hypofunction: Secondary | ICD-10-CM | POA: Diagnosis not present

## 2021-08-30 DIAGNOSIS — Z0000 Encounter for general adult medical examination without abnormal findings: Secondary | ICD-10-CM

## 2021-08-30 DIAGNOSIS — G4733 Obstructive sleep apnea (adult) (pediatric): Secondary | ICD-10-CM

## 2021-08-30 DIAGNOSIS — I10 Essential (primary) hypertension: Secondary | ICD-10-CM

## 2021-08-30 NOTE — Progress Notes (Signed)
Subjective:    Patient ID: Kyle Miles, male    DOB: 1970/08/31, 51 y.o.   MRN: [id number]  Kyle Miles is a 51 y.o. male presenting on 08/30/2021 for Annual Exam   HPI  Here for Annual Physical and Lab Review.  OSA, not on CPAP Morbid Obesity BMI >41 Previous history of snoring and his prior PCP few years ago tested for Sleep Apnea with PSG and confirmed. No longer using CPAP by his preference - He has followed with [facility] in [place] - he was treated with Phentermine and hormone therapy.- previously. No longer on medication   Pre-Diabetes Last lab result A1c 6.1% slightly inc from 6.0%, he goals to cut back reduce carb starch sugar, admits sweet tooth  Elevated LDL Result 129, prior 119. Mild increase Goal to improve wt   CHRONIC HTN: Reports no new concerns. Recent dx HTN Current Meds - Amlodipine 68m daily   Reports good compliance, took meds today. Tolerating well, w/o complaints. 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 He has episodic sharp pain with movements. Not daily, has low back pain as well very similar seems to radiate in his spine. - X-rays C-spine and L-spine done 08/2020 - He has chronic pain in neck and pain, intermittently, some days mild other days more severe Failed baclofen, gabapentin flexeril Still has low back pain flare ups, prior x-ray results reviewed.   Low Testosterone Previously seen by Urologist in 2017, for fatigue Testing at that time, showed some low testosterone, considered treatment but never followed up on it. Repeat test now shows testosterone 230 still low range but mild, unchanged in 5 years. He still declines further treatment.    Elevated TSH Again TSH slightly elevated it has improved overall in past year, 5.3, Free T4 0.9 Some symptoms of fatigue hard to attribute to thyroid only. no fam history. He prefers to avoid medicine     Health Maintenance:   Due colon CA Screening, interested in Cologuard. He still has kit but has not pursued, agrees to do this test  Declines Shingrix  PSA 0.55 (08/2021) negative. No LUTS.   [facility] 06/15/2020  Decreased Interest 0 0  Down, Depressed, Hopeless 0 0  PHQ - 2 Score 0 0  Altered sleeping 0 -  Tired, decreased energy 1 -  Change in appetite 0 -  Feeling bad or failure about yourself  0 -  Trouble concentrating 0 -  Moving slowly or fidgety/restless 0 -  Suicidal thoughts 0 -  PHQ-9 Score 1 -  Difficult doing work/chores Not difficult at all -    Past Medical History:  Diagnosis Date   Hypertension    Sleep apnea    History reviewed. No pertinent surgical history. Social History   Socioeconomic History   Marital status: Married    Spouse name: Not on file   Number of children: Not on file   Years of education: Not on file   Highest education level: Not on file  Occupational History   Not on file  Tobacco Use   Smoking status: Never   Smokeless tobacco: Never  Substance and Sexual Activity  Alcohol use: No   Drug use: Never   Sexual activity: Not on file  Other Topics Concern   Not on file  Social History Narrative   ** Merged History Encounter **       Social Determinants of Health   Financial Resource Strain: Not on file  Food Insecurity: Not on file  Transportation Needs: Not on file  Physical Activity: Not on file  Stress: Not on file  Social Connections: Not on file  Intimate Partner Violence: Not on file   Family History  Problem Relation Age of Onset   Cancer Mother    Heart disease Father 27   Kidney cancer Neg Hx    Kidney disease Neg Hx    Prostate cancer Neg Hx    Current Outpatient  Medications on File Prior to Visit  Medication Sig   amLODipine (NORVASC) 5 MG tablet Take 1 tablet (5 mg total) by mouth daily.   ascorbic acid (VITAMIN C) 500 MG tablet Take 1 tablet (500 mg total) by mouth daily.   aspirin EC 81 MG tablet Take 1 tablet (81 mg total) by mouth daily. Swallow whole.   Cinnamon 500 MG capsule Take 500 mg by mouth in the morning and at bedtime.   meloxicam (MOBIC) 15 MG tablet Take 1 tablet (15 mg total) by mouth daily as needed for pain (back pain). May take for 1-2 week, then stop and take intermittently   tiZANidine (ZANAFLEX) 4 MG tablet Take 1 tablet (4 mg total) by mouth every 8 (eight) hours as needed for muscle spasms.   zinc sulfate 220 (50 Zn) MG capsule Take 1 capsule (220 mg total) by mouth daily.   albuterol (VENTOLIN HFA) 108 (90 Base) MCG/ACT inhaler Inhale 2 puffs into the lungs every 6 (six) hours. (Patient not taking: Reported on 08/30/2021)   No current facility-administered medications on file prior to visit.    Review of Systems  Constitutional:  Negative for activity change, appetite change, chills, diaphoresis, fatigue and fever.  HENT:  Negative for congestion and hearing loss.   Eyes:  Negative for visual disturbance.  Respiratory:  Negative for cough, chest tightness, shortness of breath and wheezing.   Cardiovascular:  Negative for chest pain, palpitations and leg swelling.  Gastrointestinal:  Negative for abdominal pain, constipation, diarrhea, nausea and vomiting.  Genitourinary:  Negative for dysuria, frequency and hematuria.  Musculoskeletal:  Negative for arthralgias and neck pain.  Skin:  Negative for rash.  Neurological:  Negative for dizziness, weakness, light-headedness, numbness and headaches.  Hematological:  Negative for adenopathy.  Psychiatric/Behavioral:  Negative for behavioral problems, dysphoric mood and sleep disturbance.   Per HPI unless specifically indicated above      Objective:    BP 122/82 (BP  Location: Left Arm, Patient Position: Sitting, Cuff Size: Large)    Pulse 76    Resp 18    Ht _0  (1.753 m)    Wt 279 lb 6.4 oz (126.7 kg)    SpO2 96%    BMI 41.26 kg/m   Wt Readings from Last 3 Encounters:  08/30/21 279 lb 6.4 oz (126.7 kg)  03/28/21 272 lb (123.4 kg)  02/22/21 271 lb 9.6 oz (123.2 kg)    Physical Exam Vitals and nursing note reviewed.  Constitutional:      General: He is not in acute distress.    Appearance: He is well-developed. He is obese. He is not diaphoretic.     Comments: Well-appearing, comfortable, cooperative  HENT:  Head: Normocephalic and atraumatic.  Eyes:     General:        Right eye: No discharge.        Left eye: No discharge.     Conjunctiva/sclera: Conjunctivae normal.     Pupils: Pupils are equal, round, and reactive to light.  Neck:     Thyroid: No thyromegaly.     Vascular: No carotid bruit.  Cardiovascular:     Rate and Rhythm: Normal rate and regular rhythm.     Pulses: Normal pulses.     Heart sounds: Normal heart sounds. No murmur heard. Pulmonary:     Effort: Pulmonary effort is normal. No respiratory distress.     Breath sounds: Normal breath sounds. No wheezing or rales.  Abdominal:     General: Bowel sounds are normal. There is no distension.     Palpations: Abdomen is soft. There is no mass.     Tenderness: There is no abdominal tenderness.  Musculoskeletal:        General: No tenderness. Normal range of motion.     Cervical back: Normal range of motion and neck supple.     Right lower leg: No edema.     Left lower leg: No edema.     Comments: Upper / Lower Extremities: - Normal muscle tone, strength bilateral upper extremities 5/5, lower extremities 5/5  Lymphadenopathy:     Cervical: No cervical adenopathy.  Skin:    General: Skin is warm and dry.     Findings: No erythema or rash.  Neurological:     Mental Status: He is alert and oriented to person, place, and time.     Comments: Distal sensation intact to  light touch all extremities  Psychiatric:        Mood and Affect: Mood normal.        Behavior: Behavior normal.        Thought Content: Thought content normal.     Comments: Well groomed, good eye contact, normal speech and thoughts     Results for orders placed or performed in visit on 08/23/21  PSA  Result Value Ref Range   PSA 0.55 < OR = 4.00 ng/mL  Hemoglobin A1c  Result Value Ref Range   Hgb A1c MFr Bld 6.1 (H) <5.7 % of total Hgb   Mean Plasma Glucose 128 mg/dL   eAG (mmol/L) 7.1 mmol/L  Lipid panel  Result Value Ref Range   Cholesterol 196 <200 mg/dL   HDL 45 > OR = 40 mg/dL   Triglycerides 112 <150 mg/dL   LDL Cholesterol (Calc) 129 (H) mg/dL (calc)   Total CHOL/HDL Ratio 4.4 <5.0 (calc)   Non-HDL Cholesterol (Calc) 151 (H) <130 mg/dL (calc)  CBC with Differential/Platelet  Result Value Ref Range   WBC 5.7 3.8 - 10.8 Thousand/uL   RBC 5.13 4.20 - 5.80 Million/uL   Hemoglobin 14.6 13.2 - 17.1 g/dL   HCT 43.6 38.5 - 50.0 %   MCV 85.0 80.0 - 100.0 fL   MCH 28.5 27.0 - 33.0 pg   MCHC 33.5 32.0 - 36.0 g/dL   RDW 13.1 11.0 - 15.0 %   Platelets 323 140 - 400 Thousand/uL   MPV 9.7 7.5 - 12.5 fL   Neutro Abs 3,506 1,500 - 7,800 cells/uL   Lymphs Abs 1,562 850 - 3,900 cells/uL   Absolute Monocytes 479 200 - 950 cells/uL   Eosinophils Absolute 137 15 - 500 cells/uL   Basophils Absolute 17 0 - 200 cells/uL  Neutrophils Relative % 61.5 %   Total Lymphocyte 27.4 %   Monocytes Relative 8.4 %   Eosinophils Relative 2.4 %   Basophils Relative 0.3 %  COMPLETE METABOLIC PANEL WITH GFR  Result Value Ref Range   Glucose, Bld 120 (H) 65 - 99 mg/dL   BUN 21 7 - 25 mg/dL   Creat 1.25 0.70 - 1.30 mg/dL   eGFR 69 > OR = 60 mL/min/1.74m   BUN/Creatinine Ratio NOT APPLICABLE 6 - 22 (calc)   Sodium 140 135 - 146 mmol/L   Potassium 4.2 3.5 - 5.3 mmol/L   Chloride 103 98 - 110 mmol/L   CO2 29 20 - 32 mmol/L   Calcium 9.2 8.6 - 10.3 mg/dL   Total Protein 7.2 6.1 - 8.1 g/dL    Albumin 4.3 3.6 - 5.1 g/dL   Globulin 2.9 1.9 - 3.7 g/dL (calc)   AG Ratio 1.5 1.0 - 2.5 (calc)   Total Bilirubin 0.7 0.2 - 1.2 mg/dL   Alkaline phosphatase (APISO) 72 35 - 144 U/L   AST 22 10 - 35 U/L   ALT 26 9 - 46 U/L  T4, free  Result Value Ref Range   Free T4 0.9 0.8 - 1.8 ng/dL  TSH  Result Value Ref Range   TSH 5.31 (H) 0.40 - 4.50 mIU/L  Testosterone  Result Value Ref Range   Testosterone 230 (L) 250 - 827 ng/dL      Assessment & Plan:   Problem List Items Addressed This Visit     Pre-diabetes    Elevated A1c vs stable at 6.1 prior 6.0 Concern with obesity, HLD  Plan:  1. Not on any therapy currently  2. Encourage improved lifestyle - low carb, low sugar diet, reduce portion size, continue improving regular exercise  Future consider GLP1 for weight loss, would benefit pre DM      OSA (obstructive sleep apnea)    Chronic problem Stable without worsening Goal to improve weight. He has declined further CPAP usage      [place]    Encourage weight loss strategies with lifestyle diet exercise Discussed GLP1 therapy likely Wegovy vs Saxenda, unless insurance covers for Pre Diabetes diagnosis. next also consider nutrition / lifestyle referral      Hypogonadism in male    Repeat lab suggests Low T with 246 prior 5 yr ago Not on testosterone therapy Prefers to pursue wt loss first as discussed inc risk for low T Future can consider Urology consult for management      Essential hypertension    Controlled HTN No known complications    Plan:  1. Continue Amlodipine 578mdaily for blood pressure Keep track of BP, goal is < 140/90 Encourage improved lifestyle - low sodium diet, regular exercise  Future consider 101m     Other Visit Diagnoses     Annual physical exam    -  Primary       Updated Health Maintenance information Reviewed recent lab results with patient Encouraged improvement to lifestyle with diet and exercise Goal of  weight loss Consider GLP1 therapy Wegovy Saxenda - handout AVS check into cost coverage  Encourage completion of existing Cologuard.  No orders of the defined types were placed in this encounter.    Follow up plan: Return in about 6 months (around 02/27/2022) for 6 month PreDM A1c, Weight, f/u.  AleNobie PutnamO [facility] 08/30/2021, 4:10 PM

## 2021-08-30 NOTE — Patient Instructions (Addendum)
Thank you for coming to the office today.  Goal to work on weight loss to help multiple things including pre diabetes sugar, improve testosterone, improve sleep, and help lower cholesterol.  Ordered the Cologuard (home kit) test for colon cancer screening. Stay tuned for further updates.  Colon Cancer Screening: - For all adults age 53+ routine colon cancer screening is highly recommended.     - Recent guidelines from [place] recommend starting age of 67 - Early detection of colon cancer is important, because often there are no warning signs or symptoms, also if found early usually it can be cured. Late stage is hard to treat.   - If Cologuard is NEGATIVE, then it is good for 3 years before next due - If Cologuard is POSITIVE, then it is strongly advised to get a Colonoscopy, which allows the GI doctor to locate the source of the cancer or polyp (even very early stage) and treat it by removing it. ------------------------- Follow instructions to collect sample, you may call the company for any help or questions, 24/7 telephone support at [phone].   Call insurance find cost and coverage of the following - check the following: - Drug Tier, Preferred List, On Formulary - All will require a "Prior Authorization" from [place] first, before you can find out the cost - Find out if there is "Step Therapy" (other medicines required before you can try these)  Once you pick the one you want to try, let me know - we can get a sample ready IF we have it in stock. Then try it - and before running out of medicine, contact me back to order your Rx so we have time to get it processed.  For Pre-Diabetes / Diabetes   1. Ozempic (Semaglutide injection) - start 0.75m weekly for 4 weeks then increase to 0.558mweekly, sample is 6 doses, re-use the same pen until empty, new needle each dose.   2. Trulicity (Dulaglutide) - once weekly 0.75 (likely we would start) and 1.5 max dose, sample is 2  doses, 1 dose per pen, each pen is a one time use, no visible needle, it is an auto-injector.  ----------  For Weight Loss / Obesity only  Wegovy (same as Ozempic) weekly injection - start 0.2556meekly, 1 dose per pen, single use, auto-injector  2. Saxenda - DAILY injection - start 0.6mg59mjection DAILY, sample is 3 weeks, new needle each dose.   3 benefits - 1 significantly reduced A1c sugar, and may be able to reduce or stop metformin in future - 2 reduced appetite and weight loss with good results - 3 cardiovascular risk reduction, less likely to have heart attack/stroke   Please schedule a Follow-up Appointment to: Return in about 6 months (around 02/27/2022) for 6 month PreDM A1c, Weight, f/u.  If you have any other questions or concerns, please feel free to call the office or send a message through [email or web address] may also schedule an earlier appointment if necessary.  Additionally, you may be receiving a survey about your experience at our office within a few days to 1 week by e-mail or mail. We value your feedback.  AlexNobie [place] [address]

## 2021-08-31 NOTE — Assessment & Plan Note (Signed)
Encourage weight loss strategies with lifestyle diet exercise Discussed GLP1 therapy likely Wegovy vs Saxenda, unless insurance covers for Pre Diabetes diagnosis. next also consider nutrition / lifestyle referral 

## 2021-08-31 NOTE — Assessment & Plan Note (Signed)
Repeat lab suggests Low T with 230, prior 5 yr ago Not on testosterone therapy Prefers to pursue wt loss first as discussed inc risk for low T Future can consider Urology consult for management

## 2021-08-31 NOTE — Assessment & Plan Note (Signed)
Chronic problem Stable without worsening Goal to improve weight. He has declined further CPAP usage 

## 2021-08-31 NOTE — Assessment & Plan Note (Signed)
Elevated A1c vs stable at 6.1 prior 6.0 Concern with obesity, HLD  Plan:  1. Not on any therapy currently  2. Encourage improved lifestyle - low carb, low sugar diet, reduce portion size, continue improving regular exercise  Future consider GLP1 for weight loss, would benefit pre DM

## 2021-08-31 NOTE — Assessment & Plan Note (Signed)
Controlled HTN No known complications    Plan:  1. Continue Amlodipine 5mg daily for blood pressure Keep track of BP, goal is < 140/90 Encourage improved lifestyle - low sodium diet, regular exercise  Future consider 10mg  

## 2022-02-27 ENCOUNTER — Ambulatory Visit (INDEPENDENT_AMBULATORY_CARE_PROVIDER_SITE_OTHER): Payer: BC Managed Care – PPO | Admitting: Family Medicine

## 2022-02-27 ENCOUNTER — Encounter: Payer: Self-pay | Admitting: Family Medicine

## 2022-02-27 VITALS — BP 140/82 | HR 79 | Ht 69.0 in | Wt 271.4 lb

## 2022-02-27 DIAGNOSIS — M5136 Other intervertebral disc degeneration, lumbar region: Secondary | ICD-10-CM | POA: Diagnosis not present

## 2022-02-27 DIAGNOSIS — R7303 Prediabetes: Secondary | ICD-10-CM

## 2022-02-27 DIAGNOSIS — G8929 Other chronic pain: Secondary | ICD-10-CM

## 2022-02-27 DIAGNOSIS — M5030 Other cervical disc degeneration, unspecified cervical region: Secondary | ICD-10-CM

## 2022-02-27 DIAGNOSIS — M545 Low back pain, unspecified: Secondary | ICD-10-CM | POA: Diagnosis not present

## 2022-02-27 DIAGNOSIS — M542 Cervicalgia: Secondary | ICD-10-CM

## 2022-02-27 LAB — POCT GLYCOSYLATED HEMOGLOBIN (HGB A1C): Hemoglobin A1C: 5.8 % — AB (ref 4.0–5.6)

## 2022-02-27 MED ORDER — TIZANIDINE HCL 4 MG PO TABS: 4.0000 mg | ORAL_TABLET | Freq: Four times a day (QID) | ORAL | 3 refills | Status: DC | PRN

## 2022-02-27 MED ORDER — MELOXICAM 15 MG PO TABS: 15.0000 mg | ORAL_TABLET | Freq: Every day | ORAL | 2 refills | Status: DC | PRN

## 2022-02-27 NOTE — Patient Instructions (Addendum)
Thank you for coming to the office today.  Referral sent to [facility] through [facility] & [facility]IC[facility] [address] [place], [place]  [address] Phone: [phone]  They will call you, if not heard in 2 weeks, you call them.  Start back on Meloxicam anti inflammatory as needed once per day. Do not take ibuprofen advil aleve  Restart Tizanidine 4mg  muscle relaxant up to twice a day or every 6 hours after work.   ---------------------  Recent Labs    03/23/21 0936 08/23/21 0805 02/27/22 1601  HGBA1C 6.0* 6.1* 5.8*     Call insurance find cost and coverage of the following - check the following: - Drug Tier, Preferred List, On Formulary - All will require a "Prior Authorization" from 03/01/22 first, before you can find out the cost - Find out if there is "Step Therapy" (other medicines required before you can try these)  Once you pick the one you want to try, let me know - we can get a sample ready IF we have it in stock. Then try it - and before running out of medicine, contact me back to order your Rx so we have time to get it processed.  For Weight Loss / Obesity only  Wegovy (same as Ozempic) weekly injection - start 0.25mg  weekly, 1 dose per pen, single use, auto-injector  2. Saxenda - DAILY injection - start 0.6mg  injection DAILY, you can increase the dose by 1 notch or 0.6 mg per week, if you don't tolerate a dose, can reduce it the next day.  3. Contrave - oral medication, appetite suppression has wellbutrin/bupropion and naltrexone in it and it can also help with appetite, it is ordered through a speciality pharmacy.  Or can consider Metformin  Future make sure your insurance has weight loss coverage  WEIGHT MANAGEMENT  Dr [place]  [facility] [address] [place], [place] Ph: [phone] ------------------------------------------  For mild to moderate Obstructive  Sleep Apnea and/or problems with Snoring, a custom dental oral appliance may help improve breathing while sleeping and treat these problems.  Please check into these available local options to see if they are in your network and if an oral appliance is a good fit for you. If this option does not work or if you would prefer, then sleep study if you haven't done it already or CPAP machine are the next options.  [place] [facility] [phone] [facility] [address] [place], [place] P: [phone]  [facility] [address] [place], [place] [phone]  [phone], DDS [address], [address] [place], [place] [phone]  [facility] [address], [address] [place], [place] [phone]  [facility] : [facility] [address] [place], [place] [phone]  [facility] [address] [place], [place] [phone]    Please schedule a Follow-up Appointment to: Return in about 3 months (around 05/30/2022) for 3 month PreDM A1c, Weight, HTN, Back Pain.  If you have any other questions or concerns, please feel free to call the office or send a message through MyChart. You may also schedule an earlier appointment if necessary.  Additionally, you may be receiving a survey about your experience at our office within a few days to 1 week by e-mail or mail. We value your feedback.  06/01/2022, DO [facility], [facility]

## 2022-02-27 NOTE — Progress Notes (Signed)
Subjective:    Patient ID: Kyle Miles, male    DOB: [birth year], 51 y.o.   MRN: [id number]  Kyle Miles is a 51 y.o. male presenting on 02/27/2022 for Prediabetes and Obesity   HPI  OSA, not on CPAP Morbid Obesity BMI >40 Previous history of snoring and his prior PCP few years ago tested for Sleep Apnea with PSG and confirmed. No longer using CPAP by his preference Off Phentermine Asking about other med options Wife on Wegovy   Pre-Diabetes Last result previously past year with 6.0 to 6.2 Now due today for A1c Significant improved diet lifestyle reduced breads, sodas.    CHRONIC HTN: Reports no new concerns. Recent dx HTN Current Meds - Amlodipine 5mg  daily   Reports good compliance, took meds today. Tolerating well, w/o complaints. 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 He has episodic sharp pain with movements. Not daily, has low back pain as well very similar seems to radiate in his spine. - X-rays C-spine and L-spine done 08/2020 - He has chronic pain in neck and pain, intermittently, some days mild other days more severe Failed baclofen, gabapentin flexeril Still has low back pain flare ups, prior x-ray results reviewed.  Back Spasms, episodic His job requires a lot of heavy lifting        08/30/2021    3:51 PM 06/15/2020    3:35 PM  Depression screen PHQ 2/9  Decreased Interest 0 0  Down, Depressed, Hopeless 0 0  PHQ - 2 Score 0 0  Altered sleeping 0   Tired, decreased energy 1   Change in appetite 0   Feeling bad or failure about yourself  0   Trouble concentrating 0   Moving slowly or fidgety/restless 0   Suicidal thoughts 0   PHQ-9 Score 1   Difficult  doing work/chores Not difficult at all     Social History   Tobacco Use   Smoking status: Never   Smokeless tobacco: Never  Substance Use Topics   Alcohol use: No   Drug use: Never    Review of Systems Per HPI unless specifically indicated above     Objective:    BP (!) 140/82 (BP Location: Left Arm, Cuff Size: Large)   Pulse 79   Ht 5\' 9"  (1.753 m)   Wt 271 lb 6.4 oz (123.1 kg)   SpO2 96%   BMI 40.08 kg/m   Wt Readings from Last 3 Encounters:  02/27/22 271 lb 6.4 oz (123.1 kg)  08/30/21 279 lb 6.4 oz (126.7 kg)  03/28/21 272 lb (123.4 kg)    Physical Exam Vitals and nursing note reviewed.  Constitutional:      General: He is not in acute distress.    Appearance: He is well-developed. He is obese. He is not diaphoretic.     Comments: Well-appearing, comfortable, cooperative  HENT:     Head: Normocephalic and atraumatic.  Eyes:     General:        Right eye: No discharge.        Left eye: No discharge.     Conjunctiva/sclera: Conjunctivae normal.  Neck:     Thyroid: No thyromegaly.  Cardiovascular:     Rate and Rhythm: Normal rate and regular rhythm.     Pulses: Normal pulses.     Heart sounds: Normal heart sounds. No murmur heard. Pulmonary:     Effort: Pulmonary effort is normal. No respiratory distress.     Breath sounds: Normal breath sounds. No wheezing or rales.  Musculoskeletal:        General: Normal range of motion.     Cervical back: Normal range of motion and neck supple.  Lymphadenopathy:     Cervical: No cervical adenopathy.  Skin:    General: Skin is warm and dry.     Findings: No erythema or rash.  Neurological:     Mental Status: He is alert and oriented to person, place, and time. Mental status is at baseline.  Psychiatric:        Behavior: Behavior normal.     Comments: Well groomed, good eye contact, normal speech and thoughts     I have personally reviewed the radiology report from X-ray 08/26/20  CLINICAL DATA:  Chronic neck  pain   EXAM: CERVICAL SPINE - COMPLETE 4+ VIEW   COMPARISON:  None.   FINDINGS: There is straightening of the cervical spine, possibly related to underlying muscular spasm. There is no acute fracture or listhesis of the cervical spine. Vertebral body height has been preserved. There is intervertebral disc space narrowing and endplate remodeling of C3-C7, most severe at C5-6, in keeping with changes of mild to moderate degenerative disc disease. Oblique views demonstrate mild left neuroforaminal narrowing at C5-6 secondary to uncovertebral arthrosis. Remaining neural foramina are widely patent. Lateral masses of C1 are well aligned with the body of C2. The prevertebral soft tissues are not thickened. The spinal canal is widely patent.   IMPRESSION: Mild to moderate diffuse degenerative disc disease.   Left C5-6 neuroforaminal narrowing secondary to uncovertebral arthrosis.   Mild straightening of the cervical spine, possibly related to underlying muscular spasm.     Electronically Signed   By: Helyn Numbers MD   On: 08/26/2020 13:45  -----  CLINICAL DATA:  Chronic low back pain   EXAM: LUMBAR SPINE - COMPLETE 4+ VIEW   COMPARISON:  None.   FINDINGS: Five view radiograph cervical spine demonstrates normal lumbar lordosis. No acute fracture or listhesis of the lumbar spine. Mild intervertebral disc space narrowing and endplate remodeling of L2-L5, most severe at L3-4, is in keeping with mild degenerative disc disease. Vertebral body heights and remaining intervertebral disc heights are preserved. The paraspinal soft tissues are unremarkable. Oblique views demonstrate no evidence of pars defect.   IMPRESSION: Mild degenerative disc disease.     Electronically Signed   By: Helyn Numbers MD   On: 08/26/2020 13:46   Recent Labs    03/23/21 0936 08/23/21 0805 02/27/22 1601  HGBA1C 6.0* 6.1* 5.8*    Results for orders placed or performed in visit on  02/27/22  POCT HgB A1C  Result Value Ref Range   Hemoglobin A1C 5.8 (A) 4.0 - 5.6 %      Assessment & Plan:   Problem List Items Addressed This Visit     Chronic bilateral low back pain without sciatica   Relevant Medications   tiZANidine (ZANAFLEX) 4 MG tablet   meloxicam (MOBIC) 15 MG tablet   Other Relevant Orders   Ambulatory referral to Orthopedic Surgery   Chronic neck pain   Relevant Medications   tiZANidine (ZANAFLEX) 4 MG tablet   meloxicam (MOBIC) 15  MG tablet   Other Relevant Orders   Ambulatory referral to Orthopedic Surgery   DDD (degenerative disc disease), cervical   Relevant Medications   tiZANidine (ZANAFLEX) 4 MG tablet   meloxicam (MOBIC) 15 MG tablet   Other Relevant Orders   Ambulatory referral to Orthopedic Surgery   DDD (degenerative disc disease), lumbar   Relevant Medications   tiZANidine (ZANAFLEX) 4 MG tablet   meloxicam (MOBIC) 15 MG tablet   Other Relevant Orders   Ambulatory referral to Orthopedic Surgery   Pre-diabetes - Primary   Relevant Orders   POCT HgB A1C (Completed)    chronic neck and low back, and muscle spasms of mid back, he has old parachute whiplash injury >30 years ago with [employer] service, he does heavy lifting still at work, prior imaging X-rays show diffuse DDD, requesting further management, has tried med management   Start back on Meloxicam anti inflammatory as needed once per day. Do not take ibuprofen advil aleve  Restart Tizanidine 4mg  muscle relaxant up to twice a day or every 6 hours after work.  Pre-Diabetes Morbid Obesity BMI A1c down to 5.8 from prior 6.0 to 6.2 Improved lifestyle diet Not on medication Discuss GLP1 therapy options vs Contrave for weight management  Possible OSA He has declined sleep study Will consider oral appliance Wt loss  HTN Elevated BP today On Amlodipine 5mg  daily Goal for wt loss Adjust medication if still not optimal control   Orders Placed This Encounter  Procedures    Ambulatory referral to Orthopedic Surgery    Referral Priority:   Routine    Referral Type:   Surgical    Referral Reason:   Specialty Services Required    Requested Specialty:   Orthopedic Surgery    Number of Visits Requested:   1   POCT HgB A1C      Meds ordered this encounter  Medications   tiZANidine (ZANAFLEX) 4 MG tablet    Sig: Take 1 tablet (4 mg total) by mouth every 6 (six) hours as needed for muscle spasms.    Dispense:  180 tablet    Refill:  3   meloxicam (MOBIC) 15 MG tablet    Sig: Take 1 tablet (15 mg total) by mouth daily as needed for pain (back pain). May take for 1-2 week, then stop and take intermittently    Dispense:  30 tablet    Refill:  2     Follow up plan: Return in about 3 months (around 05/30/2022) for 3 month PreDM A1c, Weight, HTN, Back Pain.   , DO [facility] 02/27/2022, 4:00 PM

## 2022-03-04 ENCOUNTER — Other Ambulatory Visit: Payer: Self-pay | Admitting: Family Medicine

## 2022-03-04 DIAGNOSIS — I10 Essential (primary) hypertension: Secondary | ICD-10-CM

## 2022-03-06 NOTE — Telephone Encounter (Signed)
Requested Prescriptions  Pending Prescriptions Disp Refills  . amLODipine (NORVASC) 5 MG tablet [Pharmacy Med Name: amLODIPine Besylate 5 MG Oral Tablet] 90 tablet 1    Sig: Take 1 tablet by mouth once daily     Cardiovascular: Calcium Channel Blockers 2 Failed - 03/04/2022  4:39 PM      Failed - Last BP in normal range    BP Readings from Last 1 Encounters:  02/27/22 (!) 140/82         Passed - Last Heart Rate in normal range    Pulse Readings from Last 1 Encounters:  02/27/22 79         Passed - Valid encounter within last 6 months    Recent Outpatient Visits          1 week ago Pre-diabetes   [facility] [place], Netta Neat, DO   6 months ago Annual physical exam   [facility] Smitty Cords, DO   11 months ago Pre-diabetes   [facility] [place], Netta Neat, DO   1 year ago Atypical chest pain   [facility] Smitty Cords, DO   1 year ago Annual physical exam   [facility] [place], Netta Neat, DO

## 2022-04-24 DIAGNOSIS — G8929 Other chronic pain: Secondary | ICD-10-CM | POA: Diagnosis not present

## 2022-04-24 DIAGNOSIS — M545 Low back pain, unspecified: Secondary | ICD-10-CM | POA: Diagnosis not present

## 2022-04-24 DIAGNOSIS — M542 Cervicalgia: Secondary | ICD-10-CM | POA: Diagnosis not present

## 2022-06-05 ENCOUNTER — Other Ambulatory Visit: Payer: Self-pay | Admitting: Family Medicine

## 2022-06-05 ENCOUNTER — Encounter: Payer: Self-pay | Admitting: Family Medicine

## 2022-06-05 ENCOUNTER — Ambulatory Visit: Payer: BC Managed Care – PPO | Admitting: Family Medicine

## 2022-06-05 VITALS — BP 134/80 | HR 66 | Ht 69.0 in | Wt 266.0 lb

## 2022-06-05 DIAGNOSIS — M5136 Other intervertebral disc degeneration, lumbar region: Secondary | ICD-10-CM

## 2022-06-05 DIAGNOSIS — Z0000 Encounter for general adult medical examination without abnormal findings: Secondary | ICD-10-CM

## 2022-06-05 DIAGNOSIS — G8929 Other chronic pain: Secondary | ICD-10-CM

## 2022-06-05 DIAGNOSIS — E7800 Pure hypercholesterolemia, unspecified: Secondary | ICD-10-CM

## 2022-06-05 DIAGNOSIS — R7303 Prediabetes: Secondary | ICD-10-CM | POA: Diagnosis not present

## 2022-06-05 DIAGNOSIS — R351 Nocturia: Secondary | ICD-10-CM

## 2022-06-05 DIAGNOSIS — R7989 Other specified abnormal findings of blood chemistry: Secondary | ICD-10-CM

## 2022-06-05 DIAGNOSIS — I10 Essential (primary) hypertension: Secondary | ICD-10-CM

## 2022-06-05 DIAGNOSIS — G4733 Obstructive sleep apnea (adult) (pediatric): Secondary | ICD-10-CM

## 2022-06-05 DIAGNOSIS — M545 Low back pain, unspecified: Secondary | ICD-10-CM

## 2022-06-05 DIAGNOSIS — M5030 Other cervical disc degeneration, unspecified cervical region: Secondary | ICD-10-CM | POA: Diagnosis not present

## 2022-06-05 LAB — POCT GLYCOSYLATED HEMOGLOBIN (HGB A1C): Hemoglobin A1C: 6.2 % — AB (ref 4.0–5.6)

## 2022-06-05 MED ORDER — TRAMADOL HCL 50 MG PO TABS: 50.0000 mg | ORAL_TABLET | Freq: Three times a day (TID) | ORAL | 0 refills | Status: DC | PRN

## 2022-06-05 NOTE — Progress Notes (Signed)
Subjective:    Patient ID: Kyle Miles, male    DOB: Aug 04, 1970, 51 y.o.   MRN: [id number]  Kyle Miles is a 51 y.o. male presenting on 06/05/2022 for Prediabetes   HPI  OSA, not on CPAP Morbid Obesity BMI >39 Previous history of snoring and his prior PCP few years ago tested for Sleep Apnea with PSG and confirmed. No longer using CPAP by his preference   Pre-Diabetes Past history A1c 5.8 to 6.2 Last result 5.8 Now due today for A1c Admits diet worsened in past     CHRONIC HTN: Reports no new concerns. Recent dx HTN Current Meds - Amlodipine 5mg  daily   Reports good compliance, took meds today. Tolerating well, w/o complaints. 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 He has episodic sharp pain with movements. Not daily, has low back pain as well very similar seems to radiate in his spine. - X-rays C-spine and L-spine, recently updated 04/2022 by orthopedics at [facility]  - He has chronic pain in neck and pain, intermittently, some days mild other days more severe  Back Spasms, episodic His job requires a lot of heavy lifting  They referred him to Physical Therapy      08/30/2021    3:51 PM 06/15/2020    3:35 PM  Depression screen PHQ 2/9  Decreased Interest 0 0  Down, Depressed, Hopeless 0 0  PHQ - 2 Score 0 0  Altered sleeping 0   Tired, decreased energy 1   Change in appetite 0   Feeling bad or failure about yourself  0   Trouble concentrating 0   Moving slowly or fidgety/restless 0   Suicidal thoughts 0   PHQ-9 Score 1    Difficult doing work/chores Not difficult at all     Social History   Tobacco Use   Smoking status: Never   Smokeless tobacco: Never  Substance Use Topics   Alcohol use: No   Drug use: Never    Review of Systems Per HPI unless specifically indicated above     Objective:    BP 134/80 (BP Location: Left Arm, Cuff Size: Large)   Pulse 66   Ht 5\' 9"  (1.753 m)   Wt 266 lb (120.7 kg)   SpO2 99%   BMI 39.28 kg/m   Wt Readings from Last 3 Encounters:  06/05/22 266 lb (120.7 kg)  02/27/22 271 lb 6.4 oz (123.1 kg)  08/30/21 279 lb 6.4 oz (126.7 kg)    Physical Exam Vitals and nursing note reviewed.  Constitutional:      General: He is not in acute distress.    Appearance: He is well-developed. He is obese. He is not diaphoretic.     Comments: Well-appearing, comfortable, cooperative  HENT:     Head: Normocephalic and atraumatic.  Eyes:     General:        Right eye: No discharge.        Left eye: No discharge.     Conjunctiva/sclera: Conjunctivae normal.  Neck:     Thyroid: No  thyromegaly.  Cardiovascular:     Rate and Rhythm: Normal rate and regular rhythm.     Pulses: Normal pulses.     Heart sounds: Normal heart sounds. No murmur heard. Pulmonary:     Effort: Pulmonary effort is normal. No respiratory distress.     Breath sounds: Normal breath sounds. No wheezing or rales.  Musculoskeletal:        General: Normal range of motion.     Cervical back: Normal range of motion and neck supple.  Lymphadenopathy:     Cervical: No cervical adenopathy.  Skin:    General: Skin is warm and dry.     Findings: No erythema or rash.  Neurological:     Mental Status: He is alert and oriented to person, place, and time. Mental status is at baseline.  Psychiatric:        Behavior: Behavior normal.     Comments: Well groomed, good eye contact, normal speech and thoughts      Results for orders placed or performed in visit on 06/05/22  POCT HgB A1C  Result Value Ref  Range   Hemoglobin A1C 6.2 (A) 4.0 - 5.6 %      Assessment & Plan:   Problem List Items Addressed This Visit     Chronic bilateral low back pain without sciatica   Relevant Medications   traMADol (ULTRAM) 50 MG tablet   DDD (degenerative disc disease), cervical   Relevant Medications   traMADol (ULTRAM) 50 MG tablet   DDD (degenerative disc disease), lumbar   Relevant Medications   traMADol (ULTRAM) 50 MG tablet   Morbid obesity (HCC)   Pre-diabetes - Primary   Relevant Orders   POCT HgB A1C (Completed)    Worsening PreDM A1c up to 6.2, goal to improve lifestyle diet Offer Metformin GLP1, he defers meds at this time.  Goal to limit carb starches sugars.  Try to keep reducing the weight to help the back  Weight in morbid obesity range, but improved  Stop taking Tizanidine if not helping  Start the Tramadol as needed max 3 times a day, or mostly night time, caution with sedation groggy and dependence.  Follow up with Orthopedic and Physical Therapy.  Meds ordered this encounter  Medications   traMADol (ULTRAM) 50 MG tablet    Sig: Take 1 tablet (50 mg total) by mouth every 8 (eight) hours as needed.    Dispense:  30 tablet    Refill:  0      Follow up plan: Return in about 3 months (around 09/05/2022) for 3 month fasting lab only then 1 week later Annual Physical.  Future labs ordered for 09/05/22   Kyle Miles, [facility] 06/05/2022, 4:09 PM

## 2022-06-05 NOTE — Patient Instructions (Addendum)
Thank you for coming to the office today.  Recent Labs    08/23/21 0805 02/27/22 1601 06/05/22 1610  HGBA1C 6.1* 5.8* 6.2*   Goal to limit carb starches sugars.  Try to keep reducing the weight to help the back  Stop taking Tizanidine if not helping  Start the Tramadol as needed max 3 times a day, or mostly night time, caution with sedation groggy and dependence.  Follow up with Orthopedic and Physical Therapy.  DUE for FASTING BLOOD WORK (no food or drink after midnight before the lab appointment, only water or coffee without cream/sugar on the morning of)  SCHEDULE "Lab Only" visit in the morning at the clinic for lab draw in 3 MONTHS   - Make sure Lab Only appointment is at about 1 week before your next appointment, so that results will be available  For Lab Results, once available within 2-3 days of blood draw, you can can log in to MyChart online to view your results and a brief explanation. Also, we can discuss results at next follow-up visit.    Please schedule a Follow-up Appointment to: Return in about 3 months (around 09/05/2022) for 3 month fasting lab only then 1 week later Annual Physical.  If you have any other questions or concerns, please feel free to call the office or send a message through MyChart. You may also schedule an earlier appointment if necessary.  Additionally, you may be receiving a survey about your experience at our office within a few days to 1 week by e-mail or mail. We value your feedback.  Saralyn Pilar, DO [facility], [place]

## 2022-08-14 DIAGNOSIS — G8929 Other chronic pain: Secondary | ICD-10-CM | POA: Diagnosis not present

## 2022-08-14 DIAGNOSIS — M545 Low back pain, unspecified: Secondary | ICD-10-CM | POA: Diagnosis not present

## 2022-08-21 DIAGNOSIS — G8929 Other chronic pain: Secondary | ICD-10-CM | POA: Diagnosis not present

## 2022-08-21 DIAGNOSIS — M545 Low back pain, unspecified: Secondary | ICD-10-CM | POA: Diagnosis not present

## 2022-09-04 ENCOUNTER — Other Ambulatory Visit: Payer: Self-pay

## 2022-09-04 DIAGNOSIS — R7303 Prediabetes: Secondary | ICD-10-CM

## 2022-09-04 DIAGNOSIS — R351 Nocturia: Secondary | ICD-10-CM

## 2022-09-04 DIAGNOSIS — E7800 Pure hypercholesterolemia, unspecified: Secondary | ICD-10-CM

## 2022-09-04 DIAGNOSIS — I10 Essential (primary) hypertension: Secondary | ICD-10-CM

## 2022-09-04 DIAGNOSIS — Z0000 Encounter for general adult medical examination without abnormal findings: Secondary | ICD-10-CM

## 2022-09-04 DIAGNOSIS — R7989 Other specified abnormal findings of blood chemistry: Secondary | ICD-10-CM

## 2022-09-04 DIAGNOSIS — G8929 Other chronic pain: Secondary | ICD-10-CM | POA: Diagnosis not present

## 2022-09-04 DIAGNOSIS — M545 Low back pain, unspecified: Secondary | ICD-10-CM | POA: Diagnosis not present

## 2022-09-05 ENCOUNTER — Other Ambulatory Visit: Payer: BC Managed Care – PPO

## 2022-09-05 DIAGNOSIS — I10 Essential (primary) hypertension: Secondary | ICD-10-CM | POA: Diagnosis not present

## 2022-09-05 DIAGNOSIS — R7303 Prediabetes: Secondary | ICD-10-CM | POA: Diagnosis not present

## 2022-09-05 DIAGNOSIS — E7800 Pure hypercholesterolemia, unspecified: Secondary | ICD-10-CM | POA: Diagnosis not present

## 2022-09-05 DIAGNOSIS — R351 Nocturia: Secondary | ICD-10-CM | POA: Diagnosis not present

## 2022-09-05 DIAGNOSIS — R7989 Other specified abnormal findings of blood chemistry: Secondary | ICD-10-CM | POA: Diagnosis not present

## 2022-09-06 LAB — HEMOGLOBIN A1C
Hgb A1c MFr Bld: 6.2 % of total Hgb — ABNORMAL HIGH (ref <5.7)
Mean Plasma Glucose: 131 mg/dL
eAG (mmol/L): 7.3 mmol/L

## 2022-09-06 LAB — COMPLETE METABOLIC PANEL WITH GFR
AG Ratio: 1.7 (calc) (ref 1.0–2.5)
ALT: 19 U/L (ref 9–46)
AST: 19 U/L (ref 10–35)
Albumin: 4.3 g/dL (ref 3.6–5.1)
Alkaline phosphatase (APISO): 78 U/L (ref 35–144)
BUN: 17 mg/dL (ref 7–25)
CO2: 30 mmol/L (ref 20–32)
Calcium: 9.2 mg/dL (ref 8.6–10.3)
Chloride: 104 mmol/L (ref 98–110)
Creat: 1.16 mg/dL (ref 0.70–1.30)
Globulin: 2.5 g/dL (calc) (ref 1.9–3.7)
Glucose, Bld: 117 mg/dL — ABNORMAL HIGH (ref 65–99)
Potassium: 4.4 mmol/L (ref 3.5–5.3)
Sodium: 141 mmol/L (ref 135–146)
Total Bilirubin: 0.7 mg/dL (ref 0.2–1.2)
Total Protein: 6.8 g/dL (ref 6.1–8.1)
eGFR: 75 mL/min/{1.73_m2} (ref >=60)

## 2022-09-06 LAB — CBC WITH DIFFERENTIAL/PLATELET
Absolute Monocytes: 309 cells/uL (ref 200–950)
Basophils Absolute: 29 cells/uL (ref 0–200)
Basophils Relative: 0.6 %
Eosinophils Absolute: 98 cells/uL (ref 15–500)
Eosinophils Relative: 2 %
HCT: 42.7 % (ref 38.5–50.0)
Hemoglobin: 14.3 g/dL (ref 13.2–17.1)
Lymphs Abs: 1367 cells/uL (ref 850–3900)
MCH: 28.4 pg (ref 27.0–33.0)
MCHC: 33.5 g/dL (ref 32.0–36.0)
MCV: 84.7 fL (ref 80.0–100.0)
MPV: 9.9 fL (ref 7.5–12.5)
Monocytes Relative: 6.3 %
Neutro Abs: 3097 cells/uL (ref 1500–7800)
Neutrophils Relative %: 63.2 %
Platelets: 340 10*3/uL (ref 140–400)
RBC: 5.04 10*6/uL (ref 4.20–5.80)
RDW: 13 % (ref 11.0–15.0)
Total Lymphocyte: 27.9 %
WBC: 4.9 10*3/uL (ref 3.8–10.8)

## 2022-09-06 LAB — LIPID PANEL
Cholesterol: 190 mg/dL (ref <200)
HDL: 44 mg/dL (ref >=40)
LDL Cholesterol (Calc): 123 mg/dL (calc) — ABNORMAL HIGH
Non-HDL Cholesterol (Calc): 146 mg/dL (calc) — ABNORMAL HIGH (ref <130)
Total CHOL/HDL Ratio: 4.3 (calc) (ref <5.0)
Triglycerides: 123 mg/dL (ref <150)

## 2022-09-06 LAB — T4, FREE: Free T4: 0.9 ng/dL (ref 0.8–1.8)

## 2022-09-06 LAB — TSH: TSH: 3.99 mIU/L (ref 0.40–4.50)

## 2022-09-06 LAB — PSA: PSA: 0.6 ng/mL (ref <=4.00)

## 2022-09-11 ENCOUNTER — Ambulatory Visit (INDEPENDENT_AMBULATORY_CARE_PROVIDER_SITE_OTHER): Payer: BC Managed Care – PPO | Admitting: Family Medicine

## 2022-09-11 ENCOUNTER — Encounter: Payer: Self-pay | Admitting: Family Medicine

## 2022-09-11 VITALS — BP 118/76 | HR 77 | Ht 69.0 in | Wt 271.2 lb

## 2022-09-11 DIAGNOSIS — M545 Low back pain, unspecified: Secondary | ICD-10-CM

## 2022-09-11 DIAGNOSIS — I10 Essential (primary) hypertension: Secondary | ICD-10-CM | POA: Diagnosis not present

## 2022-09-11 DIAGNOSIS — M5136 Other intervertebral disc degeneration, lumbar region: Secondary | ICD-10-CM

## 2022-09-11 DIAGNOSIS — Z1211 Encounter for screening for malignant neoplasm of colon: Secondary | ICD-10-CM | POA: Diagnosis not present

## 2022-09-11 DIAGNOSIS — Z0000 Encounter for general adult medical examination without abnormal findings: Secondary | ICD-10-CM

## 2022-09-11 DIAGNOSIS — G4733 Obstructive sleep apnea (adult) (pediatric): Secondary | ICD-10-CM

## 2022-09-11 DIAGNOSIS — G8929 Other chronic pain: Secondary | ICD-10-CM

## 2022-09-11 DIAGNOSIS — R7303 Prediabetes: Secondary | ICD-10-CM

## 2022-09-11 MED ORDER — AMLODIPINE BESYLATE 5 MG PO TABS: 5.0000 mg | ORAL_TABLET | Freq: Every day | ORAL | 3 refills | Status: DC

## 2022-09-11 MED ORDER — METOPROLOL SUCCINATE ER 25 MG PO TB24: 25.0000 mg | ORAL_TABLET | Freq: Every day | ORAL | 3 refills | Status: DC

## 2022-09-11 NOTE — Progress Notes (Unsigned)
Subjective:    Patient ID: Kyle Miles, male    DOB: 1971/03/16, 52 y.o.   MRN: [id number]  Kyle Miles is a 52 y.o. male presenting on 09/11/2022 for Annual Exam   HPI  Here for Annual Physical and Lab Review  OSA, not on CPAP Morbid Obesity BMI >39 Previous history of snoring and his prior PCP few years ago tested for Sleep Apnea with PSG and confirmed. No longer using CPAP by his preference Not on CPAP   Pre-Diabetes Past history A1c 5.8 to 6.2 Last result A1c 6.2    CHRONIC HTN: Reports no new concerns. Recent dx HTN Current Meds - Amlodipine '5mg'$  daily   Reports good compliance, took meds today. Tolerating well, w/o complaints. 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 He has episodic sharp pain with movements. Not daily, has low back pain as well very similar seems to radiate in his spine. - X-rays C-spine and L-spine, recently updated 04/2022 by orthopedics at [facility]  - He has chronic pain in neck and pain, intermittently, some days mild other days more severe Back Spasms, episodic His job requires a lot of heavy lifting Tramadol refill due return to ortho for future injections PT   Elevated TSH Resolved on last lab TSH and T4 normal   Health Maintenance:   Due colon CA Screening, interested in Cologuard. He still has kit but has not pursued - it has expired.and he will need new kit.   Declines Shingrix. Future if interested.   PSA 0.60 (08/2022) negative. No LUTS.     08/30/2021    3:51 PM 06/15/2020    3:35 PM   Depression screen PHQ 2/9  Decreased Interest 0 0  Down, Depressed, Hopeless 0 0  PHQ - 2 Score 0 0  Altered sleeping 0   Tired, decreased energy 1   Change in appetite 0   Feeling bad or failure about yourself  0   Trouble concentrating 0   Moving slowly or fidgety/restless 0   Suicidal thoughts 0   PHQ-9 Score 1   Difficult doing work/chores Not difficult at all     Past Medical History:  Diagnosis Date   Hypertension    Sleep apnea    History reviewed. No pertinent surgical history. Social History   Socioeconomic History   Marital status: Married    Spouse name: Not on file   Number of children: Not on file   Years of education: Not on file   Highest education level: Not on file  Occupational History   Not on file  Tobacco Use   Smoking status: Never   Smokeless tobacco: Never  Substance and Sexual Activity   Alcohol use: No   Drug use: Never   Sexual activity: Not on file  Other Topics Concern   Not on file  Social History Narrative   ** Merged History Encounter **       Social Determinants of Health   Financial Resource Strain: Not on file  Food Insecurity: Not on file  Transportation  Needs: Not on file  Physical Activity: Not on file  Stress: Not on file  Social Connections: Not on file  Intimate Partner Violence: Not on file   Family History  Problem Relation Age of Onset   Cancer Mother    Heart disease Father 99   Kidney cancer Neg Hx    Kidney disease Neg Hx    Prostate cancer Neg Hx    Current Outpatient Medications on File Prior to Visit  Medication Sig   albuterol (VENTOLIN HFA) 108 (90 Base) MCG/ACT inhaler Inhale 2 puffs into the lungs every 6 (six) hours.   ascorbic acid (VITAMIN C) 500 MG tablet Take 1 tablet (500 mg total) by mouth daily.   Cinnamon 500 MG capsule Take 500 mg by mouth in the morning and at bedtime.   meloxicam (MOBIC) 15 MG tablet Take 1 tablet (15 mg total) by mouth daily as needed for pain (back pain). May take  for 1-2 week, then stop and take intermittently   Multiple Vitamin (MULTI-VITAMIN) tablet Take 1 tablet by mouth daily.   omega-3 acid ethyl esters (LOVAZA) 1 g capsule Take 1 g by mouth daily.   zinc sulfate 220 (50 Zn) MG capsule Take 1 capsule (220 mg total) by mouth daily.   No current facility-administered medications on file prior to visit.    Review of Systems  Constitutional:  Negative for activity change, appetite change, chills, diaphoresis, fatigue and fever.  HENT:  Negative for congestion and hearing loss.   Eyes:  Negative for visual disturbance.  Respiratory:  Negative for cough, chest tightness, shortness of breath and wheezing.   Cardiovascular:  Negative for chest pain, palpitations and leg swelling.  Gastrointestinal:  Negative for abdominal pain, constipation, diarrhea, nausea and vomiting.  Genitourinary:  Negative for dysuria, frequency and hematuria.  Musculoskeletal:  Negative for arthralgias and neck pain.  Skin:  Negative for rash.  Neurological:  Negative for dizziness, weakness, light-headedness, numbness and headaches.  Hematological:  Negative for adenopathy.  Psychiatric/Behavioral:  Negative for behavioral problems, dysphoric mood and sleep disturbance.    Per HPI unless specifically indicated above      Objective:    BP 118/76   Pulse 77   Ht '5\' 9"'$  (1.753 m)   Wt 271 lb 3.2 oz (123 kg)   SpO2 96%   BMI 40.05 kg/m   Wt Readings from Last 3 Encounters:  09/11/22 271 lb 3.2 oz (123 kg)  06/05/22 266 lb (120.7 kg)  02/27/22 271 lb 6.4 oz (123.1 kg)    Physical Exam Vitals and nursing note reviewed.  Constitutional:      General: He is not in acute distress.    Appearance: He is well-developed. He is not diaphoretic.     Comments: Well-appearing, comfortable, cooperative  HENT:     Head: Normocephalic and atraumatic.  Eyes:     General:        Right eye: No discharge.        Left eye: No discharge.     Conjunctiva/sclera: Conjunctivae  normal.     Pupils: Pupils are equal, round, and reactive to light.  Neck:     Thyroid: No thyromegaly.     Vascular: No carotid bruit.  Cardiovascular:     Rate and Rhythm: Normal rate and regular rhythm.     Pulses: Normal pulses.     Heart sounds: Normal heart sounds. No murmur heard. Pulmonary:     Effort: Pulmonary effort is normal. No respiratory distress.  Breath sounds: Normal breath sounds. No wheezing or rales.  Abdominal:     General: Bowel sounds are normal. There is no distension.     Palpations: Abdomen is soft. There is no mass.     Tenderness: There is no abdominal tenderness.  Musculoskeletal:        General: No tenderness. Normal range of motion.     Cervical back: Normal range of motion and neck supple.     Right lower leg: No edema.     Left lower leg: No edema.     Comments: Upper / Lower Extremities: - Normal muscle tone, strength bilateral upper extremities 5/5, lower extremities 5/5  Lymphadenopathy:     Cervical: No cervical adenopathy.  Skin:    General: Skin is warm and dry.     Findings: No erythema or rash.  Neurological:     Mental Status: He is alert and oriented to person, place, and time.     Comments: Distal sensation intact to light touch all extremities  Psychiatric:        Mood and Affect: Mood normal.        Behavior: Behavior normal.        Thought Content: Thought content normal.     Comments: Well groomed, good eye contact, normal speech and thoughts     Results for orders placed or performed in visit on 09/04/22  T4, free  Result Value Ref Range   Free T4 0.9 0.8 - 1.8 ng/dL  TSH  Result Value Ref Range   TSH 3.99 0.40 - 4.50 mIU/L  PSA  Result Value Ref Range   PSA 0.60 < OR = 4.00 ng/mL  Hemoglobin A1c  Result Value Ref Range   Hgb A1c MFr Bld 6.2 (H) <5.7 % of total Hgb   Mean Plasma Glucose 131 mg/dL   eAG (mmol/L) 7.3 mmol/L  Lipid panel  Result Value Ref Range   Cholesterol 190 <200 mg/dL   HDL 44 > OR = 40  mg/dL   Triglycerides 123 <150 mg/dL   LDL Cholesterol (Calc) 123 (H) mg/dL (calc)   Total CHOL/HDL Ratio 4.3 <5.0 (calc)   Non-HDL Cholesterol (Calc) 146 (H) <130 mg/dL (calc)  CBC with Differential/Platelet  Result Value Ref Range   WBC 4.9 3.8 - 10.8 Thousand/uL   RBC 5.04 4.20 - 5.80 Million/uL   Hemoglobin 14.3 13.2 - 17.1 g/dL   HCT 42.7 38.5 - 50.0 %   MCV 84.7 80.0 - 100.0 fL   MCH 28.4 27.0 - 33.0 pg   MCHC 33.5 32.0 - 36.0 g/dL   RDW 13.0 11.0 - 15.0 %   Platelets 340 140 - 400 Thousand/uL   MPV 9.9 7.5 - 12.5 fL   Neutro Abs 3,097 1,500 - 7,800 cells/uL   Lymphs Abs 1,367 850 - 3,900 cells/uL   Absolute Monocytes 309 200 - 950 cells/uL   Eosinophils Absolute 98 15 - 500 cells/uL   Basophils Absolute 29 0 - 200 cells/uL   Neutrophils Relative % 63.2 %   Total Lymphocyte 27.9 %   Monocytes Relative 6.3 %   Eosinophils Relative 2.0 %   Basophils Relative 0.6 %  COMPLETE METABOLIC PANEL WITH GFR  Result Value Ref Range   Glucose, Bld 117 (H) 65 - 99 mg/dL   BUN 17 7 - 25 mg/dL   Creat 1.16 0.70 - 1.30 mg/dL   eGFR 75 > OR = 60 mL/min/1.18m   BUN/Creatinine Ratio SEE NOTE: 6 - 22 (calc)  Sodium 141 135 - 146 mmol/L   Potassium 4.4 3.5 - 5.3 mmol/L   Chloride 104 98 - 110 mmol/L   CO2 30 20 - 32 mmol/L   Calcium 9.2 8.6 - 10.3 mg/dL   Total Protein 6.8 6.1 - 8.1 g/dL   Albumin 4.3 3.6 - 5.1 g/dL   Globulin 2.5 1.9 - 3.7 g/dL (calc)   AG Ratio 1.7 1.0 - 2.5 (calc)   Total Bilirubin 0.7 0.2 - 1.2 mg/dL   Alkaline phosphatase (APISO) 78 35 - 144 U/L   AST 19 10 - 35 U/L   ALT 19 9 - 46 U/L      Assessment & Plan:   Problem List Items Addressed This Visit     Chronic bilateral low back pain without sciatica   DDD (degenerative disc disease), lumbar   Essential hypertension    Controlled HTN No known complications    Plan:  1. Continue Amlodipine '5mg'$  daily for blood pressure Keep track of BP, goal is < 140/90 Encourage improved lifestyle - low sodium  diet, regular exercise  Future consider '10mg'$        Relevant Medications   omega-3 acid ethyl esters (LOVAZA) 1 g capsule   amLODipine (NORVASC) 5 MG tablet   metoprolol succinate (TOPROL-XL) 25 MG 24 hr tablet   Morbid obesity ([place])    Encourage weight loss strategies with lifestyle diet exercise Discussed GLP1 therapy likely Wegovy vs Saxenda, unless insurance covers for Pre Diabetes diagnosis. next also consider nutrition / lifestyle referral      OSA (obstructive sleep apnea)    Chronic problem Stable without worsening Goal to improve weight. He has declined further CPAP usage      Pre-diabetes    Elevated A1c 6.2 Concern with obesity, HLD  Plan:  1. Not on any therapy currently  2. Encourage improved lifestyle - low carb, low sugar diet, reduce portion size, continue improving regular exercise  Consider metformin vs GLP1 for wt loss      Other Visit Diagnoses     Annual physical exam    -  Primary   Screening for colon cancer       Relevant Orders   Cologuard       Updated Health Maintenance information Reviewed recent lab results with patient Encouraged improvement to lifestyle with diet and exercise Goal of weight loss   Melatonin 1-'3mg'$  at night, can increase to 5-10 mg max.  Future Shingrix shingles vaccine 2 doses 2-6 months part  Due for routine colon cancer screening. Never had colonoscopy (not interested), no family history colon cancer. - Discussion today about recommendations for either Colonoscopy or Cologuard screening, benefits and risks of screening, interested in Cologuard, understands that if positive then recommendation is for diagnostic colonoscopy to follow-up. - Ordered Cologuard today   Orders Placed This Encounter  Procedures   Cologuard     Meds ordered this encounter  Medications   amLODipine (NORVASC) 5 MG tablet    Sig: Take 1 tablet (5 mg total) by mouth daily.    Dispense:  90 tablet    Refill:  3   metoprolol  succinate (TOPROL-XL) 25 MG 24 hr tablet    Sig: Take 1 tablet (25 mg total) by mouth daily.    Dispense:  90 tablet    Refill:  3      Follow up plan: Return in about 6 months (around 03/12/2023) for 6 month PreDM A1c.  Nobie Putnam, [facility] 09/11/2022, 4:16 PM

## 2022-09-11 NOTE — Patient Instructions (Addendum)
Thank you for coming to the office today.  Melatonin 1-'3mg'$  at night, can increase to 5-10 mg max.  Future Shingrix shingles vaccine 2 doses 2-6 months part  Recent Labs    02/27/22 1601 06/05/22 1610 09/05/22 0809  HGBA1C 5.8* 6.2* 6.2*   Goal to improve lifestyle  low carb starches sugars.  -----------------------  Ordered the Cologuard (home kit) test for colon cancer screening. Stay tuned for further updates.  It will be shipped to you directly. If not received in 2-4 weeks, call us or the company.   If you send it back and no results are received in 2-4 weeks, call us or the company as well!   Colon Cancer Screening: - For all adults age 19+ routine colon cancer screening is highly recommended.     - Recent guidelines from [place] recommend starting age of 18 - Early detection of colon cancer is important, because often there are no warning signs or symptoms, also if found early usually it can be cured. Late stage is hard to treat.   - If Cologuard is NEGATIVE, then it is good for 3 years before next due - If Cologuard is POSITIVE, then it is strongly advised to get a Colonoscopy, which allows the GI doctor to locate the source of the cancer or polyp (even very early stage) and treat it by removing it. ------------------------- Follow instructions to collect sample, you may call the company for any help or questions, 24/7 telephone support at [phone].    Please schedule a Follow-up Appointment to: Return in about 6 months (around 03/12/2023) for 6 month PreDM A1c.  If you have any other questions or concerns, please feel free to call the office or send a message through [place]. You may also schedule an earlier appointment if necessary.  Additionally, you may be receiving a survey about your experience at our office within a few days to 1 week by e-mail or mail. We value your feedback.  Nobie Putnam, DO [place]

## 2022-09-12 ENCOUNTER — Other Ambulatory Visit: Payer: Self-pay | Admitting: Family Medicine

## 2022-09-12 DIAGNOSIS — M5136 Other intervertebral disc degeneration, lumbar region: Secondary | ICD-10-CM

## 2022-09-12 DIAGNOSIS — M545 Low back pain, unspecified: Secondary | ICD-10-CM

## 2022-09-12 DIAGNOSIS — M5030 Other cervical disc degeneration, unspecified cervical region: Secondary | ICD-10-CM

## 2022-09-12 MED ORDER — TRAMADOL HCL 50 MG PO TABS: 50.0000 mg | ORAL_TABLET | Freq: Three times a day (TID) | ORAL | 0 refills | Status: DC | PRN

## 2022-09-12 NOTE — Assessment & Plan Note (Signed)
Controlled HTN No known complications    Plan:  1. Continue Amlodipine '5mg'$  daily for blood pressure Keep track of BP, goal is < 140/90 Encourage improved lifestyle - low sodium diet, regular exercise  Future consider '10mg'$ 

## 2022-09-12 NOTE — Assessment & Plan Note (Signed)
Encourage weight loss strategies with lifestyle diet exercise Discussed GLP1 therapy likely Wegovy vs Saxenda, unless insurance covers for Pre Diabetes diagnosis. next also consider nutrition / lifestyle referral

## 2022-09-12 NOTE — Assessment & Plan Note (Signed)
Chronic problem Stable without worsening Goal to improve weight. He has declined further CPAP usage

## 2022-09-12 NOTE — Assessment & Plan Note (Addendum)
Elevated A1c 6.2 Concern with obesity, HLD  Plan:  1. Not on any therapy currently  2. Encourage improved lifestyle - low carb, low sugar diet, reduce portion size, continue improving regular exercise  Consider metformin vs GLP1 for wt loss

## 2022-09-18 DIAGNOSIS — M545 Low back pain, unspecified: Secondary | ICD-10-CM | POA: Diagnosis not present

## 2022-09-18 DIAGNOSIS — G8929 Other chronic pain: Secondary | ICD-10-CM | POA: Diagnosis not present

## 2022-10-02 DIAGNOSIS — M545 Low back pain, unspecified: Secondary | ICD-10-CM | POA: Diagnosis not present

## 2022-10-02 DIAGNOSIS — G8929 Other chronic pain: Secondary | ICD-10-CM | POA: Diagnosis not present

## 2022-10-16 DIAGNOSIS — M545 Low back pain, unspecified: Secondary | ICD-10-CM | POA: Diagnosis not present

## 2022-10-16 DIAGNOSIS — G8929 Other chronic pain: Secondary | ICD-10-CM | POA: Diagnosis not present

## 2022-11-06 DIAGNOSIS — Z1211 Encounter for screening for malignant neoplasm of colon: Secondary | ICD-10-CM | POA: Diagnosis not present

## 2022-11-14 LAB — COLOGUARD: COLOGUARD: NEGATIVE

## 2022-11-27 DIAGNOSIS — G8929 Other chronic pain: Secondary | ICD-10-CM | POA: Diagnosis not present

## 2022-11-27 DIAGNOSIS — M5459 Other low back pain: Secondary | ICD-10-CM | POA: Diagnosis not present

## 2022-11-27 DIAGNOSIS — M545 Low back pain, unspecified: Secondary | ICD-10-CM | POA: Diagnosis not present

## 2022-11-28 ENCOUNTER — Other Ambulatory Visit: Payer: Self-pay | Admitting: Physician Assistant

## 2022-11-28 DIAGNOSIS — M545 Low back pain, unspecified: Secondary | ICD-10-CM

## 2022-12-07 ENCOUNTER — Encounter: Payer: Self-pay | Admitting: Physician Assistant

## 2022-12-09 ENCOUNTER — Ambulatory Visit
Admission: RE | Admit: 2022-12-09 | Discharge: 2022-12-09 | Disposition: A | Discharge status: Home / Self Care | Payer: BC Managed Care – PPO | Source: Ambulatory Visit | Attending: Physician Assistant | Admitting: Physician Assistant

## 2022-12-09 DIAGNOSIS — M545 Low back pain, unspecified: Secondary | ICD-10-CM

## 2023-01-06 ENCOUNTER — Ambulatory Visit
Admission: RE | Admit: 2023-01-06 | Discharge: 2023-01-06 | Disposition: A | Discharge status: Home / Self Care | Payer: BC Managed Care – PPO | Source: Ambulatory Visit | Attending: Physician Assistant | Admitting: Physician Assistant

## 2023-01-06 DIAGNOSIS — M47816 Spondylosis without myelopathy or radiculopathy, lumbar region: Secondary | ICD-10-CM | POA: Diagnosis not present

## 2023-01-06 DIAGNOSIS — G8929 Other chronic pain: Secondary | ICD-10-CM

## 2023-01-06 DIAGNOSIS — M5126 Other intervertebral disc displacement, lumbar region: Secondary | ICD-10-CM | POA: Diagnosis not present

## 2023-01-06 DIAGNOSIS — M48061 Spinal stenosis, lumbar region without neurogenic claudication: Secondary | ICD-10-CM | POA: Diagnosis not present

## 2023-02-01 DIAGNOSIS — G8929 Other chronic pain: Secondary | ICD-10-CM | POA: Diagnosis not present

## 2023-02-01 DIAGNOSIS — M5416 Radiculopathy, lumbar region: Secondary | ICD-10-CM | POA: Diagnosis not present

## 2023-02-01 DIAGNOSIS — M5441 Lumbago with sciatica, right side: Secondary | ICD-10-CM | POA: Diagnosis not present

## 2023-02-26 DIAGNOSIS — M5416 Radiculopathy, lumbar region: Secondary | ICD-10-CM | POA: Diagnosis not present

## 2023-03-12 ENCOUNTER — Ambulatory Visit: Payer: BC Managed Care – PPO | Admitting: Family Medicine

## 2023-03-12 ENCOUNTER — Encounter: Payer: Self-pay | Admitting: Family Medicine

## 2023-03-12 ENCOUNTER — Other Ambulatory Visit: Payer: Self-pay | Admitting: Family Medicine

## 2023-03-12 VITALS — BP 138/80 | HR 70 | Resp 16 | Ht 69.0 in | Wt 266.0 lb

## 2023-03-12 DIAGNOSIS — G4733 Obstructive sleep apnea (adult) (pediatric): Secondary | ICD-10-CM | POA: Diagnosis not present

## 2023-03-12 DIAGNOSIS — R351 Nocturia: Secondary | ICD-10-CM

## 2023-03-12 DIAGNOSIS — I10 Essential (primary) hypertension: Secondary | ICD-10-CM

## 2023-03-12 DIAGNOSIS — R7303 Prediabetes: Secondary | ICD-10-CM

## 2023-03-12 DIAGNOSIS — Z125 Encounter for screening for malignant neoplasm of prostate: Secondary | ICD-10-CM

## 2023-03-12 DIAGNOSIS — Z0000 Encounter for general adult medical examination without abnormal findings: Secondary | ICD-10-CM

## 2023-03-12 DIAGNOSIS — R7989 Other specified abnormal findings of blood chemistry: Secondary | ICD-10-CM

## 2023-03-12 DIAGNOSIS — J0110 Acute frontal sinusitis, unspecified: Secondary | ICD-10-CM

## 2023-03-12 LAB — POCT GLYCOSYLATED HEMOGLOBIN (HGB A1C): Hemoglobin A1C: 6.2 % — AB (ref 4.0–5.6)

## 2023-03-12 MED ORDER — FLUTICASONE PROPIONATE 50 MCG/ACT NA SUSP
2.0000 | Freq: Every day | NASAL | 0 refills | Status: AC
Start: 2023-03-12 — End: ?

## 2023-03-12 MED ORDER — BENZONATATE 100 MG PO CAPS
100.0000 mg | ORAL_CAPSULE | Freq: Three times a day (TID) | ORAL | 0 refills | Status: DC | PRN
Start: 2023-03-12 — End: 2023-09-14

## 2023-03-12 NOTE — Progress Notes (Signed)
Subjective:    Patient ID: Kyle Miles, male    DOB: 1970/07/21, 52 y.o.   MRN: [id number]  Kyle Miles is a 52 y.o. male presenting on 03/12/2023 for Follow-up (6 month follow up Pre DM)   HPI  OSA, not on CPAP Morbid Obesity BMI >39 Previous history of snoring and his prior PCP few years ago tested for Sleep Apnea with PSG and confirmed. No longer using CPAP by his preference Not on CPAP. He may return to CPAP    Pre-Diabetes Past history A1c 5.8 to 6.2 Last result A1c 6.2, unchanged for 2 results. 6.2 - twice  in a row Due today for A1c  Weight down 5-6 lbs in 6 months Reduced portion sizes No new significant exercise trend or change  CHRONIC HTN: Reports no new concerns. Recent dx HTN Current Meds - Amlodipine 5mg  daily   Reports good compliance, took meds today. Tolerating well, w/o complaints. Denies CP, dyspnea, HA, edema, dizziness / lightheadedness   Cough 3-4 days, with cough worse at night Not having wheezing Has sensation in back of throat / tickling not endorsing heartburn  Back Pain He has followed by Dr Mariah Milling [facility] Phys[facility], still doing heavy lifting, has received ESI therapy with improvement, will consider change of work w/ lifting in future      03/12/2023    3:31 PM 08/30/2021    3:51 PM 06/15/2020    3:35 PM  Depression screen PHQ 2/9  Decreased Interest 0 0 0  Down, Depressed, Hopeless 0 0 0  PHQ - 2 Score 0 0 0  Altered sleeping 0 0   Tired, decreased energy 0 1   Change in appetite 0 0   Feeling bad or failure about yourself  0 0   Trouble concentrating 0 0   Moving slowly or fidgety/restless 0 0   Suicidal thoughts 0 0   PHQ-9 Score 0 1   Difficult doing work/chores Not difficult at all Not difficult at all     Social History   Tobacco Use   Smoking status: Never   Smokeless tobacco: Never  Substance Use Topics   Alcohol use: No   Drug use: Never    Review of Systems Per HPI unless specifically indicated above      Objective:    BP 138/80 (BP Location: Right Arm, Patient Position: Sitting, Cuff Size: Large)   Pulse 70   Resp 16   Ht 5\' 9"  (1.753 m)   Wt 266 lb (120.7 kg)   BMI 39.28 kg/m   Wt Readings from Last 3 Encounters:  03/12/23 266 lb (120.7 kg)  09/11/22 271 lb 3.2 oz (123 kg)  06/05/22 266 lb (120.7 kg)    Physical Exam Vitals and nursing note reviewed.  Constitutional:      General: He is not in acute distress.    Appearance: Normal appearance. He is well-developed. He is obese. He is not diaphoretic.     Comments: Well-appearing, comfortable, cooperative  HENT:     Head: Normocephalic and atraumatic.  Eyes:     General:        Right eye: No discharge.        Left eye: No discharge.     Conjunctiva/sclera: Conjunctivae normal.  Cardiovascular:     Rate and Rhythm: Normal rate.  Pulmonary:     Effort: Pulmonary effort is normal.  Skin:    General: Skin is warm and dry.     Findings: No erythema or  rash.  Neurological:     Mental Status: He is alert and oriented to person, place, and time.  Psychiatric:        Mood and Affect: Mood normal.        Behavior: Behavior normal.        Thought Content: Thought content normal.     Comments: Well groomed, good eye contact, normal speech and thoughts       Results for orders placed or performed in visit on 03/12/23  POCT glycosylated hemoglobin (Hb A1C)  Result Value Ref Range   Hemoglobin A1C 6.2 (A) 4.0 - 5.6 %   HbA1c POC (<> result, manual entry)     HbA1c, POC (prediabetic range)     HbA1c, POC (controlled diabetic range)        Assessment & Plan:   Problem List Items Addressed This Visit     Essential hypertension    Controlled HYPERTENSION Complication OSA   Plan:  1. Continue Amlodipine 5mg  daily for blood pressure Keep track of BP, goal is < 140/90 Encourage improved lifestyle - low sodium diet, regular exercise  Emphasis on weight loss vs CPAP in future      Morbid obesity (H[facility])   OSA  (obstructive sleep apnea)    Chronic problem Stable without worsening Goal to improve weight. He has declined further CPAP usage  Emphasis on reconsidering CPAP vs losing wt      Pre-diabetes - Primary    Elevated A1c 6.2, stable 1  yr Concern with obesity, HLD  Plan:  1. Not on any therapy currently  2. Encourage improved lifestyle - low carb, low sugar diet, reduce portion size, continue improving regular exercise  Consider metformin vs GLP1 for wt loss      Relevant Orders   POCT glycosylated hemoglobin (Hb A1C) (Completed)   Other Visit Diagnoses     Acute non-recurrent frontal sinusitis       Relevant Medications   fluticasone (FLONASE) 50 MCG/ACT nasal spray   benzonatate (TESSALON) 100 MG capsule       PreDM A1c 6.2, stable for past >1 yr  BP is controlled.  Cough, night-time / tickle in throat Most likely sinus drainage triggering tickle in throat. Start nasal steroid Flonase 2 sprays in each nostril daily for 4-6 weeks, may repeat course seasonally or as needed Start Tessalon Perls take 1 capsule up to 3 times a day as needed for cough  Consider GERD If this does not resolve, we can consider rx stomach acid medication, often this can explain the cough. Recommend order or OTC Omeprazole (Prilosec) 20 or 40mg  daily before breakfast or 1st meal of day for 4-6+ weeks.  Work note to return tomorrow, missed time from today.  Regarding back issues I agree with your concern, future heavy lifting can impact the back and provoke the same problems.  Meds ordered this encounter  Medications   fluticasone (FLONASE) 50 MCG/ACT nasal spray    Sig: Place 2 sprays into both nostrils daily. Use for 4-6 weeks then stop and use seasonally or as needed.    Dispense:  16 g    Refill:  0   benzonatate (TESSALON) 100 MG capsule    Sig: Take 1 capsule (100 mg total) by mouth 3 (three) times daily as needed for cough.    Dispense:  30 capsule    Refill:  0      Follow  up plan: Return in about 6 months (around 09/12/2023) for 6 month  fasting lab only then 1 week later Annual Physical.  Future labs ordered for 09/07/23   Saralyn Pilar, DO [facility] 03/12/2023, 3:52 PM

## 2023-03-12 NOTE — Assessment & Plan Note (Signed)
Controlled HYPERTENSION Complication OSA   Plan:  1. Continue Amlodipine 5mg  daily for blood pressure Keep track of BP, goal is < 140/90 Encourage improved lifestyle - low sodium diet, regular exercise  Emphasis on weight loss vs CPAP in future

## 2023-03-12 NOTE — Assessment & Plan Note (Addendum)
Elevated A1c 6.2, stable 1  yr Concern with obesity, HLD  Plan:  1. Not on any therapy currently  2. Encourage improved lifestyle - low carb, low sugar diet, reduce portion size, continue improving regular exercise  Consider metformin vs GLP1 for wt loss

## 2023-03-12 NOTE — Assessment & Plan Note (Signed)
Chronic problem Stable without worsening Goal to improve weight. He has declined further CPAP usage  Emphasis on reconsidering CPAP vs losing wt

## 2023-03-12 NOTE — Patient Instructions (Addendum)
Thank you for coming to the office today.  Recent Labs    06/05/22 1610 09/05/22 0809 03/12/23 1610  HGBA1C 6.2* 6.2* 6.2*   Today checked A1c  BP is controlled.  Most likely sinus drainage triggering tickle in throat.  Start nasal steroid Flonase 2 sprays in each nostril daily for 4-6 weeks, may repeat course seasonally or as needed  Start Tessalon Perls take 1 capsule up to 3 times a day as needed for cough   If this does not resolve, we can consider rx stomach acid medication, often this can explain the cough.  Recommend order or OTC Omeprazole (Prilosec) 20 or 40mg  daily before breakfast or 1st meal of day for 4-6+ weeks.  Work note to return tomorrow, missed time from today.  I agree with your concern, future heavy lifting can impact the back and provoke the same problems.    Please schedule a Follow-up Appointment to: Return in about 6 months (around 09/12/2023) for 6 month fasting lab only then 1 week later Annual Physical.  If you have any other questions or concerns, please feel free to call the office or send a message through MyChart. You may also schedule an earlier appointment if necessary.  Additionally, you may be receiving a survey about your experience at our office within a few days to 1 week by e-mail or mail. We value your feedback.  Saralyn Pilar, DO [facility], [place]

## 2023-03-15 DIAGNOSIS — G8929 Other chronic pain: Secondary | ICD-10-CM | POA: Diagnosis not present

## 2023-03-15 DIAGNOSIS — M5441 Lumbago with sciatica, right side: Secondary | ICD-10-CM | POA: Diagnosis not present

## 2023-03-15 DIAGNOSIS — M5416 Radiculopathy, lumbar region: Secondary | ICD-10-CM | POA: Diagnosis not present

## 2023-07-09 DIAGNOSIS — M5416 Radiculopathy, lumbar region: Secondary | ICD-10-CM | POA: Diagnosis not present

## 2023-07-09 DIAGNOSIS — R7303 Prediabetes: Secondary | ICD-10-CM | POA: Diagnosis not present

## 2023-08-02 DIAGNOSIS — M5441 Lumbago with sciatica, right side: Secondary | ICD-10-CM | POA: Diagnosis not present

## 2023-08-02 DIAGNOSIS — M5416 Radiculopathy, lumbar region: Secondary | ICD-10-CM | POA: Diagnosis not present

## 2023-08-02 DIAGNOSIS — G8929 Other chronic pain: Secondary | ICD-10-CM | POA: Diagnosis not present

## 2023-08-13 DIAGNOSIS — M5416 Radiculopathy, lumbar region: Secondary | ICD-10-CM | POA: Diagnosis not present

## 2023-08-13 DIAGNOSIS — R7303 Prediabetes: Secondary | ICD-10-CM | POA: Diagnosis not present

## 2023-08-30 DIAGNOSIS — G8929 Other chronic pain: Secondary | ICD-10-CM | POA: Diagnosis not present

## 2023-08-30 DIAGNOSIS — M5416 Radiculopathy, lumbar region: Secondary | ICD-10-CM | POA: Diagnosis not present

## 2023-08-30 DIAGNOSIS — M5441 Lumbago with sciatica, right side: Secondary | ICD-10-CM | POA: Diagnosis not present

## 2023-08-30 DIAGNOSIS — M47816 Spondylosis without myelopathy or radiculopathy, lumbar region: Secondary | ICD-10-CM | POA: Diagnosis not present

## 2023-09-07 ENCOUNTER — Other Ambulatory Visit: Payer: Self-pay

## 2023-09-07 DIAGNOSIS — R7303 Prediabetes: Secondary | ICD-10-CM

## 2023-09-07 DIAGNOSIS — R7989 Other specified abnormal findings of blood chemistry: Secondary | ICD-10-CM

## 2023-09-07 DIAGNOSIS — Z125 Encounter for screening for malignant neoplasm of prostate: Secondary | ICD-10-CM

## 2023-09-07 DIAGNOSIS — R351 Nocturia: Secondary | ICD-10-CM | POA: Diagnosis not present

## 2023-09-07 DIAGNOSIS — Z0000 Encounter for general adult medical examination without abnormal findings: Secondary | ICD-10-CM

## 2023-09-07 DIAGNOSIS — I10 Essential (primary) hypertension: Secondary | ICD-10-CM

## 2023-09-08 LAB — CBC WITH DIFFERENTIAL/PLATELET
Absolute Lymphocytes: 1190 {cells}/uL (ref 850–3900)
Absolute Monocytes: 325 {cells}/uL (ref 200–950)
Basophils Absolute: 30 {cells}/uL (ref 0–200)
Basophils Relative: 0.6 %
Eosinophils Absolute: 150 {cells}/uL (ref 15–500)
Eosinophils Relative: 3 %
HCT: 43.2 % (ref 38.5–50.0)
Hemoglobin: 14.2 g/dL (ref 13.2–17.1)
MCH: 28.2 pg (ref 27.0–33.0)
MCHC: 32.9 g/dL (ref 32.0–36.0)
MCV: 85.7 fL (ref 80.0–100.0)
MPV: 10.1 fL (ref 7.5–12.5)
Monocytes Relative: 6.5 %
Neutro Abs: 3305 {cells}/uL (ref 1500–7800)
Neutrophils Relative %: 66.1 %
Platelets: 346 10*3/uL (ref 140–400)
RBC: 5.04 10*6/uL (ref 4.20–5.80)
RDW: 13.6 % (ref 11.0–15.0)
Total Lymphocyte: 23.8 %
WBC: 5 10*3/uL (ref 3.8–10.8)

## 2023-09-08 LAB — HEMOGLOBIN A1C
Hgb A1c MFr Bld: 6.6 %{Hb} — ABNORMAL HIGH (ref <5.7)
Mean Plasma Glucose: 143 mg/dL
eAG (mmol/L): 7.9 mmol/L

## 2023-09-08 LAB — COMPLETE METABOLIC PANEL WITH GFR
AG Ratio: 1.8 (calc) (ref 1.0–2.5)
ALT: 23 U/L (ref 9–46)
AST: 16 U/L (ref 10–35)
Albumin: 4.4 g/dL (ref 3.6–5.1)
Alkaline phosphatase (APISO): 72 U/L (ref 35–144)
BUN: 12 mg/dL (ref 7–25)
CO2: 29 mmol/L (ref 20–32)
Calcium: 9.2 mg/dL (ref 8.6–10.3)
Chloride: 104 mmol/L (ref 98–110)
Creat: 1.13 mg/dL (ref 0.70–1.30)
Globulin: 2.4 g/dL (ref 1.9–3.7)
Glucose, Bld: 123 mg/dL — ABNORMAL HIGH (ref 65–99)
Potassium: 4.3 mmol/L (ref 3.5–5.3)
Sodium: 142 mmol/L (ref 135–146)
Total Bilirubin: 0.5 mg/dL (ref 0.2–1.2)
Total Protein: 6.8 g/dL (ref 6.1–8.1)
eGFR: 77 mL/min/{1.73_m2} (ref >=60)

## 2023-09-08 LAB — LIPID PANEL
Cholesterol: 192 mg/dL (ref <200)
HDL: 45 mg/dL (ref >=40)
LDL Cholesterol (Calc): 122 mg/dL — ABNORMAL HIGH
Non-HDL Cholesterol (Calc): 147 mg/dL — ABNORMAL HIGH (ref <130)
Total CHOL/HDL Ratio: 4.3 (calc) (ref <5.0)
Triglycerides: 137 mg/dL (ref <150)

## 2023-09-08 LAB — PSA: PSA: 0.76 ng/mL (ref <=4.00)

## 2023-09-08 LAB — TSH: TSH: 3.03 m[IU]/L (ref 0.40–4.50)

## 2023-09-08 LAB — T4, FREE: Free T4: 0.9 ng/dL (ref 0.8–1.8)

## 2023-09-14 ENCOUNTER — Ambulatory Visit: Payer: BC Managed Care – PPO | Admitting: Family Medicine

## 2023-09-14 ENCOUNTER — Encounter: Payer: Self-pay | Admitting: Family Medicine

## 2023-09-14 VITALS — BP 124/82 | HR 73 | Ht 69.0 in | Wt 279.0 lb

## 2023-09-14 DIAGNOSIS — M51362 Other intervertebral disc degeneration, lumbar region with discogenic back pain and lower extremity pain: Secondary | ICD-10-CM

## 2023-09-14 DIAGNOSIS — M5030 Other cervical disc degeneration, unspecified cervical region: Secondary | ICD-10-CM

## 2023-09-14 DIAGNOSIS — M545 Low back pain, unspecified: Secondary | ICD-10-CM

## 2023-09-14 DIAGNOSIS — G8929 Other chronic pain: Secondary | ICD-10-CM

## 2023-09-14 DIAGNOSIS — R7303 Prediabetes: Secondary | ICD-10-CM

## 2023-09-14 DIAGNOSIS — Z0000 Encounter for general adult medical examination without abnormal findings: Secondary | ICD-10-CM

## 2023-09-14 DIAGNOSIS — I10 Essential (primary) hypertension: Secondary | ICD-10-CM | POA: Diagnosis not present

## 2023-09-14 DIAGNOSIS — M5136 Other intervertebral disc degeneration, lumbar region with discogenic back pain only: Secondary | ICD-10-CM

## 2023-09-14 DIAGNOSIS — E7800 Pure hypercholesterolemia, unspecified: Secondary | ICD-10-CM

## 2023-09-14 MED ORDER — TRAMADOL HCL 50 MG PO TABS: 50.0000 mg | ORAL_TABLET | Freq: Three times a day (TID) | ORAL | 0 refills | Status: AC | PRN

## 2023-09-14 NOTE — Progress Notes (Signed)
 Subjective:    Patient ID: Kyle Miles, male    DOB: 1971-07-03, 53 y.o.   MRN: [id number]  Kyle Miles is a 53 y.o. male presenting on 09/14/2023 for Annual Exam   HPI  Discussed the use of AI scribe software for clinical note transcription with the patient, who gave verbal consent to proceed.  History of Present Illness   Kyle Miles is a 53 year old male who presents for a routine follow-up regarding elevated cholesterol and blood sugar levels.  Hyperlipidemia His cholesterol levels have remained relatively stable over the past three years, with LDL levels ranging from 119 to 129 mg/dL. His total cholesterol is 192 mg/dL, with an HDL of 45 mg/dL and an LDL of 518 mg/dL. He has not previously tried cholesterol medications and is concerned about potential side effects, particularly muscle aches and pains.  His recent lab work shows normal thyroid function, with past TSH levels that were off but have since stabilized without medication. His PSA level is 0.76 ng/mL, indicating no signs of prostate cancer. His CBC and chemistry panel are normal, showing no signs of anemia or issues with kidney or liver function.   OSA, not on CPAP Morbid Obesity BMI >39 Previous history of snoring and his prior PCP few years ago tested for Sleep Apnea with PSG and confirmed. No longer using CPAP by his preference Not on CPAP. He may return to CPAP    Pre-Diabetes Past history A1c 5.8 to 6.2 He has been monitoring his blood sugar levels, with a recent hemoglobin A1c of 6.6%, which is above the diabetes threshold. Previously, his A1c was consistently at 6.2%. He acknowledges dietary lapses, partly due to steroid injections for back pain, which he believes may have contributed to the increase in his blood sugar levels. He consumes a high amount of sodas, which he identifies as a significant factor in his elevated sugar levels. Weight Gain +13 lbs in past 6 months  CHRONIC HTN: Reports no new  concerns. Improved control Current Meds - Amlodipine 5mg  daily   Reports good compliance, took meds today. Tolerating well, w/o complaints. Denies CP, dyspnea, HA, edema, dizziness / lightheadedness  Lumbar DDD / Radiculitis Chronic Low Back pain He experiences intermittent back pain and has been receiving injections for relief. He mentions a new procedure involving a cold application to test its effectiveness. He also reports occasional pain on the side of his leg, which is sporadic and not as persistent as his back pain. He has previously used tramadol for pain management and has tried muscle relaxants such as baclofen, Flexeril, and tizanidine.   Health Maintenance: PSA 0.76 negative  Colon screening, cologuard completed in 2024, next due 2027.     09/14/2023    3:34 PM 03/12/2023    3:31 PM 08/30/2021    3:51 PM  Depression screen PHQ 2/9  Decreased Interest 0 0 0  Down, Depressed, Hopeless 0 0 0  PHQ - 2 Score 0 0 0  Altered sleeping 0 0 0  Tired, decreased energy 1 0 1  Change in appetite 0 0 0  Feeling bad or failure about yourself  0 0 0  Trouble concentrating 0 0 0  Moving slowly or fidgety/restless 0 0 0  Suicidal thoughts 0 0 0  PHQ-9 Score 1 0 1  Difficult doing work/chores Not difficult at all Not difficult at all Not difficult at all       09/14/2023    3:34 PM 03/12/2023  3:31 PM 08/30/2021    3:51 PM  GAD 7 : Generalized Anxiety Score  Nervous, Anxious, on Edge 0 0 0  Control/stop worrying 0 0 0  Worry too much - different things 0 0 0  Trouble relaxing 0 0 0  Restless 0 0 0  Easily annoyed or irritable 0 0 0  Afraid - awful might happen 0 0 0  Total GAD 7 Score 0 0 0  Anxiety Difficulty  Not difficult at all Not difficult at all     Past Medical History:  Diagnosis Date   Hypertension    Sleep apnea    History reviewed. No pertinent surgical history. Social History   Socioeconomic History   Marital status: Married    Spouse name: Not on file    Number of children: Not on file   Years of education: Not on file   Highest education level: Not on file  Occupational History   Not on file  Tobacco Use   Smoking status: Never   Smokeless tobacco: Never  Substance and Sexual Activity   Alcohol use: No   Drug use: Never   Sexual activity: Not on file  Other Topics Concern   Not on file  Social History Narrative   ** Merged History Encounter **       Social Drivers of Health   Financial Resource Strain: Not on file  Food Insecurity: Not on file  Transportation Needs: Not on file  Physical Activity: Not on file  Stress: Not on file  Social Connections: Not on file  Intimate Partner Violence: Not on file   Family History  Problem Relation Age of Onset   Cancer Mother    Heart disease Father 85   Kidney cancer Neg Hx    Kidney disease Neg Hx    Prostate cancer Neg Hx    Current Outpatient Medications on File Prior to Visit  Medication Sig   albuterol (VENTOLIN HFA) 108 (90 Base) MCG/ACT inhaler Inhale 2 puffs into the lungs every 6 (six) hours.   amLODipine (NORVASC) 5 MG tablet Take 1 tablet (5 mg total) by mouth daily.   ascorbic acid (VITAMIN C) 500 MG tablet Take 1 tablet (500 mg total) by mouth daily.   Cinnamon 500 MG capsule Take 500 mg by mouth in the morning and at bedtime.   fluticasone (FLONASE) 50 MCG/ACT nasal spray Place 2 sprays into both nostrils daily. Use for 4-6 weeks then stop and use seasonally or as needed.   metoprolol succinate (TOPROL-XL) 25 MG 24 hr tablet Take 1 tablet (25 mg total) by mouth daily.   Multiple Vitamin (MULTI-VITAMIN) tablet Take 1 tablet by mouth daily.   omega-3 acid ethyl esters (LOVAZA) 1 g capsule Take 1 g by mouth daily.   zinc sulfate 220 (50 Zn) MG capsule Take 1 capsule (220 mg total) by mouth daily.   No current facility-administered medications on file prior to visit.    Review of Systems  Constitutional:  Negative for activity change, appetite change, chills,  diaphoresis, fatigue and fever.  HENT:  Negative for congestion and hearing loss.   Eyes:  Negative for visual disturbance.  Respiratory:  Negative for cough, chest tightness, shortness of breath and wheezing.   Cardiovascular:  Negative for chest pain, palpitations and leg swelling.  Gastrointestinal:  Negative for abdominal pain, constipation, diarrhea, nausea and vomiting.  Genitourinary:  Negative for dysuria, frequency and hematuria.  Musculoskeletal:  Negative for arthralgias and neck pain.  Skin:  Negative for rash.  Neurological:  Negative for dizziness, weakness, light-headedness, numbness and headaches.  Hematological:  Negative for adenopathy.  Psychiatric/Behavioral:  Negative for behavioral problems, dysphoric mood and sleep disturbance.    Per HPI unless specifically indicated above     Objective:    BP 124/82   Pulse 73   Ht 5\' 9"  (1.753 m)   Wt 279 lb (126.6 kg)   SpO2 97%   BMI 41.20 kg/m   Wt Readings from Last 3 Encounters:  09/14/23 279 lb (126.6 kg)  03/12/23 266 lb (120.7 kg)  09/11/22 271 lb 3.2 oz (123 kg)    Physical Exam Vitals and nursing note reviewed.  Constitutional:      General: He is not in acute distress.    Appearance: He is well-developed. He is obese. He is not diaphoretic.     Comments: Well-appearing, comfortable, cooperative  HENT:     Head: Normocephalic and atraumatic.  Eyes:     General:        Right eye: No discharge.        Left eye: No discharge.     Conjunctiva/sclera: Conjunctivae normal.     Pupils: Pupils are equal, round, and reactive to light.  Neck:     Thyroid: No thyromegaly.     Vascular: No carotid bruit.  Cardiovascular:     Rate and Rhythm: Normal rate and regular rhythm.     Pulses: Normal pulses.     Heart sounds: Normal heart sounds. No murmur heard. Pulmonary:     Effort: Pulmonary effort is normal. No respiratory distress.     Breath sounds: Normal breath sounds. No wheezing or rales.  Abdominal:      General: Bowel sounds are normal. There is no distension.     Palpations: Abdomen is soft. There is no mass.     Tenderness: There is no abdominal tenderness.  Musculoskeletal:        General: No tenderness. Normal range of motion.     Cervical back: Normal range of motion and neck supple.     Right lower leg: No edema.     Left lower leg: No edema.     Comments: Upper / Lower Extremities: - Normal muscle tone, strength bilateral upper extremities 5/5, lower extremities 5/5  Lymphadenopathy:     Cervical: No cervical adenopathy.  Skin:    General: Skin is warm and dry.     Findings: No erythema or rash.  Neurological:     Mental Status: He is alert and oriented to person, place, and time.     Comments: Distal sensation intact to light touch all extremities  Psychiatric:        Mood and Affect: Mood normal.        Behavior: Behavior normal.        Thought Content: Thought content normal.     Comments: Well groomed, good eye contact, normal speech and thoughts     Results for orders placed or performed in visit on 09/07/23  T4, free   Collection Time: 09/07/23  8:06 AM  Result Value Ref Range   Free T4 0.9 0.8 - 1.8 ng/dL  TSH   Collection Time: 09/07/23  8:06 AM  Result Value Ref Range   TSH 3.03 0.40 - 4.50 mIU/L  PSA   Collection Time: 09/07/23  8:06 AM  Result Value Ref Range   PSA 0.76 < OR = 4.00 ng/mL  CBC with Differential/Platelet   Collection Time: 09/07/23  8:06 AM  Result Value Ref Range   WBC 5.0 3.8 - 10.8 Thousand/uL   RBC 5.04 4.20 - 5.80 Million/uL   Hemoglobin 14.2 13.2 - 17.1 g/dL   HCT 45.4 09.8 - 11.9 %   MCV 85.7 80.0 - 100.0 fL   MCH 28.2 27.0 - 33.0 pg   MCHC 32.9 32.0 - 36.0 g/dL   RDW 14.7 82.9 - 56.2 %   Platelets 346 140 - 400 Thousand/uL   MPV 10.1 7.5 - 12.5 fL   Neutro Abs 3,305 1,500 - 7,800 cells/uL   Absolute Lymphocytes 1,190 850 - 3,900 cells/uL   Absolute Monocytes 325 200 - 950 cells/uL   Eosinophils Absolute 150 15 - 500  cells/uL   Basophils Absolute 30 0 - 200 cells/uL   Neutrophils Relative % 66.1 %   Total Lymphocyte 23.8 %   Monocytes Relative 6.5 %   Eosinophils Relative 3.0 %   Basophils Relative 0.6 %  COMPLETE METABOLIC PANEL WITH GFR   Collection Time: 09/07/23  8:06 AM  Result Value Ref Range   Glucose, Bld 123 (H) 65 - 99 mg/dL   BUN 12 7 - 25 mg/dL   Creat 1.30 8.65 - 7.84 mg/dL   eGFR 77 > OR = 60 ON/GEX/5.28U1   BUN/Creatinine Ratio SEE NOTE: 6 - 22 (calc)   Sodium 142 135 - 146 mmol/L   Potassium 4.3 3.5 - 5.3 mmol/L   Chloride 104 98 - 110 mmol/L   CO2 29 20 - 32 mmol/L   Calcium 9.2 8.6 - 10.3 mg/dL   Total Protein 6.8 6.1 - 8.1 g/dL   Albumin 4.4 3.6 - 5.1 g/dL   Globulin 2.4 1.9 - 3.7 g/dL (calc)   AG Ratio 1.8 1.0 - 2.5 (calc)   Total Bilirubin 0.5 0.2 - 1.2 mg/dL   Alkaline phosphatase (APISO) 72 35 - 144 U/L   AST 16 10 - 35 U/L   ALT 23 9 - 46 U/L  Lipid panel   Collection Time: 09/07/23  8:06 AM  Result Value Ref Range   Cholesterol 192 <200 mg/dL   HDL 45 > OR = 40 mg/dL   Triglycerides 324 <401 mg/dL   LDL Cholesterol (Calc) 122 (H) mg/dL (calc)   Total CHOL/HDL Ratio 4.3 <5.0 (calc)   Non-HDL Cholesterol (Calc) 147 (H) <130 mg/dL (calc)  Hemoglobin U2V   Collection Time: 09/07/23  8:06 AM  Result Value Ref Range   Hgb A1c MFr Bld 6.6 (H) <5.7 % of total Hgb   Mean Plasma Glucose 143 mg/dL   eAG (mmol/L) 7.9 mmol/L      Assessment & Plan:   Problem List Items Addressed This Visit     Chronic bilateral low back pain without sciatica   Relevant Medications   traMADol (ULTRAM) 50 MG tablet   DDD (degenerative disc disease), cervical   Relevant Medications   traMADol (ULTRAM) 50 MG tablet   DDD (degenerative disc disease), lumbar   Relevant Medications   traMADol (ULTRAM) 50 MG tablet   Essential hypertension   Morbid obesity (HCC)   Pre-diabetes   Pure hypercholesterolemia   Other Visit Diagnoses       Annual physical exam    -  Primary         Updated Health Maintenance information Reviewed recent lab results with patient Encouraged improvement to lifestyle with diet and exercise Goal of weight loss  Pre-Diabetes AT RISK of type 2 diabetes progression with Hemoglobin A1c increased to 6.6%. Steroid  injections and dietary lapses likely contributed to elevated glucose. Discussed lifestyle changes and potential metformin use. - Repeat Hemoglobin A1c in 6 months. If A1c >6.5 would diagnose Type 2 Diabetes - Encourage dietary modifications, specifically reducing soda intake. - Encourage weight loss and increased physical activity. - Discuss potential use of GLP1 injections for weight or metformin if lifestyle changes are insufficient.  Hyperlipidemia, mild LDL cholesterol is 122 mg/dL. 10-year cardiovascular risk is approximately 10%. Discussed statin therapy and heart CT scan. - Discuss the potential benefits and risks of statin therapy, including common side effects like muscle aches. - Recommend a heart CT scan to assess for coronary artery disease, though he is currently hesitant. - Monitor cholesterol levels and cardiovascular risk factors.  Peripheral Neuropathy Intermittent lateral leg pain likely due to superficial nerve issue. Seems less likely from back / spinal disease Discussed non-pharmacological treatments and alpha-lipoic acid supplementation. - Recommend topical treatments, muscle rubs, ice, and heat for symptomatic relief. - Consider alpha-lipoic acid supplementation for nerve health.  General Health Maintenance Up to date on most vaccinations and screenings. Lacks shingles vaccine and tetanus booster. Not interested in receiving them currently. - Discuss the benefits of the shingles vaccine and tetanus booster, though he is not currently interested. - Continue routine health screenings as per guidelines.         No orders of the defined types were placed in this encounter.   Meds ordered this encounter   Medications   traMADol (ULTRAM) 50 MG tablet    Sig: Take 1 tablet (50 mg total) by mouth every 8 (eight) hours as needed.    Dispense:  30 tablet    Refill:  0     Follow up plan: Return in about 6 months (around 03/13/2024) for 6 month PreDM A1c.  Saralyn Pilar, DO [facility] 09/14/2023, 4:10 PM

## 2023-09-14 NOTE — Patient Instructions (Addendum)
 Thank you for coming to the office today.  Recent Labs    03/12/23 1610 09/07/23 0806  HGBA1C 6.2* 6.6*   Goal to get A1c < 6.5 at next visit  Reduce sodas and carb starch sugars  Alpha Lipoic Acid 600mg  3 times a day for nerve health  Diet Recommendations for Preventing Diabetes   REDUCE Starchy (carb) foods include: Bread, rice, pasta, potatoes, corn, crackers, bagels, muffins, all baked goods.   FRUITS - LIMIT these HIGH sugar/carb fruits = Pineapple, Watermelon, Bananas - OKAY with these MEDIUM sugar/carb fruits = Citrus, Oranges, Grapes - PREFER these LOW sugar/carb fruits = Apples, Berries, Pears, Plums  Protein foods include: Meat, fish, poultry, eggs, dairy foods, and beans such as pinto and kidney beans (beans also provide carbohydrate).   1. Eat at least 3 meals and 1-2 snacks per day. Never go more than 4-5 hours while awake without eating.   2. Limit starchy foods to TWO per meal and ONE per snack. ONE portion of a starchy  food is equal to the following:   - ONE slice of bread (or its equivalent, such as half of a hamburger bun).   - 1/2 cup of a "scoopable" starchy food such as potatoes or rice.   - 1 OUNCE (28 grams) of starchy snacks (crackers or pretzels, look on label).   - 15 grams of carbohydrate as shown on food label.   3. Both lunch and dinner should include a protein food, a carb food, and vegetables.   - Obtain twice as many veg's as protein or carbohydrate foods for both lunch and dinner.   - Try to keep frozen veg's on hand for a quick vegetable serving.     - Fresh or frozen veg's are best.   4. Breakfast should always include protein.     Next Cologuard 10/2025  Mild elevated LDL cholesterol Future reconsider cholesterol medicine   CONSIDER this option for heart screening Coronary Calcium Score Cardiac CT Scan. This is a screening test for patients aged 2-50+ with cardiovascular risk factors or who are healthy but would be interested in  Cardiovascular Screening for heart disease. Even if there is a family history of heart disease, this imaging can be useful. Typically it can be done every 5+ years or at a different timeline we agree on  The scan will look at the chest and mainly focus on the heart and identify early signs of calcium build up or blockages within the heart arteries. It is not 100% accurate for identifying blockages or heart disease, but it is useful to help [place] predict who may have some early changes or be at risk in the future for a heart attack or cardiovascular problem.  The results are reviewed by a Cardiologist and they will document the results. It should become available on MyChart. Typically the results are divided into percentiles based on other patients of the same demographic and age. So it will compare your risk to others similar to you. If you have a higher score >99 or higher percentile >75%tile, it is recommended to consider Statin cholesterol therapy and or referral to Cardiologist. I will try to help explain your results and if we have questions we can contact the Cardiologist.  You will be contacted for scheduling. Usually it is done at any imaging facility through [facility], [facility] or [facility].  The cost is $99 flat fee total and it does not go through insurance, so no authorization  is required.   Please schedule a Follow-up Appointment to: Return in about 6 months (around 03/13/2024) for 6 month PreDM A1c.  If you have any other questions or concerns, please feel free to call the office or send a message through MyChart. You may also schedule an earlier appointment if necessary.  Additionally, you may be receiving a survey about your experience at our office within a few days to 1 week by e-mail or mail. We value your feedback.  Saralyn Pilar, DO [facility], [place]

## 2023-09-17 DIAGNOSIS — M47816 Spondylosis without myelopathy or radiculopathy, lumbar region: Secondary | ICD-10-CM | POA: Diagnosis not present

## 2023-10-04 DIAGNOSIS — M5416 Radiculopathy, lumbar region: Secondary | ICD-10-CM | POA: Diagnosis not present

## 2023-10-04 DIAGNOSIS — G8929 Other chronic pain: Secondary | ICD-10-CM | POA: Diagnosis not present

## 2023-10-04 DIAGNOSIS — M5441 Lumbago with sciatica, right side: Secondary | ICD-10-CM | POA: Diagnosis not present

## 2023-10-04 DIAGNOSIS — M47816 Spondylosis without myelopathy or radiculopathy, lumbar region: Secondary | ICD-10-CM | POA: Diagnosis not present

## 2023-10-07 ENCOUNTER — Other Ambulatory Visit: Payer: Self-pay | Admitting: Family Medicine

## 2023-10-07 DIAGNOSIS — I10 Essential (primary) hypertension: Secondary | ICD-10-CM

## 2023-10-09 NOTE — Telephone Encounter (Signed)
 Requested Prescriptions  Pending Prescriptions Disp Refills   amLODipine (NORVASC) 5 MG tablet [Pharmacy Med Name: amLODIPine Besylate 5 MG Oral Tablet] 90 tablet 1    Sig: Take 1 tablet by mouth once daily     Cardiovascular: Calcium Channel Blockers 2 Failed - 10/09/2023  9:25 AM      Failed - Valid encounter within last 6 months    Recent Outpatient Visits           7 months ago Pre-diabetes   [place] [facility], Netta Neat, DO   1 year ago Annual physical exam   [place] [facility] Smitty Cords, DO   1 year ago Pre-diabetes   [place] [facility] Smitty Cords, DO   1 year ago Pre-diabetes   [place] [facility] Smitty Cords, DO   2 years ago Annual physical exam   [place] [facility] Smitty Cords, DO              Passed - Last BP in normal range    BP Readings from Last 1 Encounters:  09/14/23 124/82         Passed - Last Heart Rate in normal range    Pulse Readings from Last 1 Encounters:  09/14/23 73          metoprolol succinate (TOPROL-XL) 25 MG 24 hr tablet [Pharmacy Med Name: Metoprolol Succinate ER 25 MG Oral Tablet Extended Release 24 Hour] 90 tablet 1    Sig: Take 1 tablet by mouth once daily     Cardiovascular:  Beta Blockers Failed - 10/09/2023  9:25 AM      Failed - Valid encounter within last 6 months    Recent Outpatient Visits           7 months ago Pre-diabetes   [place] [facility] Smitty Cords, DO   1 year ago Annual physical exam   [place] [facility] Smitty Cords, DO   1 year ago Pre-diabetes   [facility] Smitty Cords, DO   1 year ago Pre-diabetes   [place] [facility] Smitty Cords, DO   2 years ago Annual physical exam   [place] [facility] Smitty Cords, DO              Passed - Last BP in normal range    BP Readings from Last 1 Encounters:  09/14/23 124/82         Passed - Last Heart Rate in normal range    Pulse Readings from Last 1 Encounters:  09/14/23 73

## 2023-10-22 DIAGNOSIS — M47816 Spondylosis without myelopathy or radiculopathy, lumbar region: Secondary | ICD-10-CM | POA: Diagnosis not present

## 2023-11-09 DIAGNOSIS — M5441 Lumbago with sciatica, right side: Secondary | ICD-10-CM | POA: Diagnosis not present

## 2023-11-09 DIAGNOSIS — M5416 Radiculopathy, lumbar region: Secondary | ICD-10-CM | POA: Diagnosis not present

## 2023-11-09 DIAGNOSIS — M47816 Spondylosis without myelopathy or radiculopathy, lumbar region: Secondary | ICD-10-CM | POA: Diagnosis not present

## 2023-11-09 DIAGNOSIS — G8929 Other chronic pain: Secondary | ICD-10-CM | POA: Diagnosis not present

## 2023-12-07 DIAGNOSIS — M47816 Spondylosis without myelopathy or radiculopathy, lumbar region: Secondary | ICD-10-CM | POA: Diagnosis not present

## 2024-01-10 DIAGNOSIS — M47816 Spondylosis without myelopathy or radiculopathy, lumbar region: Secondary | ICD-10-CM | POA: Diagnosis not present

## 2024-01-10 DIAGNOSIS — M5441 Lumbago with sciatica, right side: Secondary | ICD-10-CM | POA: Diagnosis not present

## 2024-01-10 DIAGNOSIS — G8929 Other chronic pain: Secondary | ICD-10-CM | POA: Diagnosis not present

## 2024-01-10 DIAGNOSIS — M5416 Radiculopathy, lumbar region: Secondary | ICD-10-CM | POA: Diagnosis not present

## 2024-01-21 DIAGNOSIS — R7303 Prediabetes: Secondary | ICD-10-CM | POA: Diagnosis not present

## 2024-01-21 DIAGNOSIS — M5416 Radiculopathy, lumbar region: Secondary | ICD-10-CM | POA: Diagnosis not present

## 2024-02-03 ENCOUNTER — Encounter: Payer: Self-pay | Admitting: Family Medicine

## 2024-02-04 ENCOUNTER — Emergency Department
Admission: EM | Admit: 2024-02-04 | Discharge: 2024-02-04 | Disposition: A | Discharge status: Home / Self Care | Payer: Self-pay | Attending: Emergency Medicine | Admitting: Emergency Medicine

## 2024-02-04 ENCOUNTER — Emergency Department: Payer: Self-pay

## 2024-02-04 ENCOUNTER — Other Ambulatory Visit: Payer: Self-pay

## 2024-02-04 DIAGNOSIS — M549 Dorsalgia, unspecified: Secondary | ICD-10-CM | POA: Diagnosis not present

## 2024-02-04 DIAGNOSIS — M47816 Spondylosis without myelopathy or radiculopathy, lumbar region: Secondary | ICD-10-CM | POA: Diagnosis not present

## 2024-02-04 DIAGNOSIS — M545 Low back pain, unspecified: Secondary | ICD-10-CM | POA: Diagnosis not present

## 2024-02-04 DIAGNOSIS — I10 Essential (primary) hypertension: Secondary | ICD-10-CM | POA: Diagnosis not present

## 2024-02-04 DIAGNOSIS — M791 Myalgia, unspecified site: Secondary | ICD-10-CM | POA: Diagnosis not present

## 2024-02-04 DIAGNOSIS — M48061 Spinal stenosis, lumbar region without neurogenic claudication: Secondary | ICD-10-CM | POA: Diagnosis not present

## 2024-02-04 DIAGNOSIS — Y92410 Unspecified street and highway as the place of occurrence of the external cause: Secondary | ICD-10-CM | POA: Diagnosis not present

## 2024-02-04 DIAGNOSIS — M5126 Other intervertebral disc displacement, lumbar region: Secondary | ICD-10-CM | POA: Diagnosis not present

## 2024-02-04 DIAGNOSIS — M7918 Myalgia, other site: Secondary | ICD-10-CM
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
## 2024-02-04 NOTE — Discharge Instructions (Addendum)

## 2024-02-08 DIAGNOSIS — M5416 Radiculopathy, lumbar region: Secondary | ICD-10-CM | POA: Diagnosis not present

## 2024-02-08 DIAGNOSIS — M5441 Lumbago with sciatica, right side: Secondary | ICD-10-CM | POA: Diagnosis not present

## 2024-02-08 DIAGNOSIS — G8929 Other chronic pain: Secondary | ICD-10-CM | POA: Diagnosis not present

## 2024-02-12 ENCOUNTER — Other Ambulatory Visit: Payer: Self-pay | Admitting: Physical Medicine & Rehabilitation

## 2024-02-12 DIAGNOSIS — G8929 Other chronic pain: Secondary | ICD-10-CM

## 2024-02-12 DIAGNOSIS — M5416 Radiculopathy, lumbar region: Secondary | ICD-10-CM

## 2024-02-16 ENCOUNTER — Ambulatory Visit
Admission: RE | Admit: 2024-02-16 | Discharge: 2024-02-16 | Disposition: A | Discharge status: Home / Self Care | Source: Ambulatory Visit | Attending: Physical Medicine & Rehabilitation | Admitting: Physical Medicine & Rehabilitation

## 2024-02-16 DIAGNOSIS — G8929 Other chronic pain: Secondary | ICD-10-CM

## 2024-02-16 DIAGNOSIS — M5416 Radiculopathy, lumbar region: Secondary | ICD-10-CM

## 2024-02-16 DIAGNOSIS — M48061 Spinal stenosis, lumbar region without neurogenic claudication: Secondary | ICD-10-CM | POA: Diagnosis not present

## 2024-02-16 DIAGNOSIS — M5126 Other intervertebral disc displacement, lumbar region: Secondary | ICD-10-CM | POA: Diagnosis not present

## 2024-02-21 DIAGNOSIS — M6281 Muscle weakness (generalized): Secondary | ICD-10-CM | POA: Diagnosis not present

## 2024-02-21 DIAGNOSIS — M5441 Lumbago with sciatica, right side: Secondary | ICD-10-CM | POA: Diagnosis not present

## 2024-02-21 DIAGNOSIS — G8929 Other chronic pain: Secondary | ICD-10-CM | POA: Diagnosis not present

## 2024-03-03 DIAGNOSIS — G8929 Other chronic pain: Secondary | ICD-10-CM | POA: Diagnosis not present

## 2024-03-03 DIAGNOSIS — M5441 Lumbago with sciatica, right side: Secondary | ICD-10-CM | POA: Diagnosis not present

## 2024-03-06 DIAGNOSIS — G8929 Other chronic pain: Secondary | ICD-10-CM | POA: Diagnosis not present

## 2024-03-06 DIAGNOSIS — M5441 Lumbago with sciatica, right side: Secondary | ICD-10-CM | POA: Diagnosis not present

## 2024-03-10 DIAGNOSIS — G8929 Other chronic pain: Secondary | ICD-10-CM | POA: Diagnosis not present

## 2024-03-10 DIAGNOSIS — M5441 Lumbago with sciatica, right side: Secondary | ICD-10-CM | POA: Diagnosis not present

## 2024-03-11 ENCOUNTER — Ambulatory Visit: Admitting: Family Medicine

## 2024-03-11 ENCOUNTER — Encounter: Payer: Self-pay | Admitting: Family Medicine

## 2024-03-11 VITALS — BP 132/84 | HR 93 | Ht 69.0 in | Wt 282.1 lb

## 2024-03-11 DIAGNOSIS — E7800 Pure hypercholesterolemia, unspecified: Secondary | ICD-10-CM

## 2024-03-11 DIAGNOSIS — G8929 Other chronic pain: Secondary | ICD-10-CM

## 2024-03-11 DIAGNOSIS — M5136 Other intervertebral disc degeneration, lumbar region with discogenic back pain only: Secondary | ICD-10-CM

## 2024-03-11 DIAGNOSIS — R7303 Prediabetes: Secondary | ICD-10-CM | POA: Diagnosis not present

## 2024-03-11 DIAGNOSIS — I10 Essential (primary) hypertension: Secondary | ICD-10-CM

## 2024-03-11 DIAGNOSIS — M545 Low back pain, unspecified: Secondary | ICD-10-CM

## 2024-03-11 LAB — POCT GLYCOSYLATED HEMOGLOBIN (HGB A1C): Hemoglobin A1C: 6.4 % — AB (ref 4.0–5.6)
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
## 2024-03-11 NOTE — Patient Instructions (Addendum)
 Thank you for coming to the office today.  Please contact Dr Dodson on MyChart and ask for status update on Short Term Disability, find out what the status is. It sounds like it was only approved for 2 weeks and then it was not extended.  Please ask them if they are able to offer extension of Short Term disability going forward to make it until your Neurosurgery apt on 04/09/24 or later, usually Short Term Disability can go for up to 12 weeks in most circumstances.  I am able to write a doctors note today that will keep you out of work as you are unable to perform regular duty at this time and until you see Neurosurgery 04/09/24 they can update the work note at that time with new instructions.  If you can get an extension of Short Term Disability from Dr Dodson office, that is fine, if you cannot get an extension OR you need me to fill out a NEW CASE to restart the process on Short Term Disability, please let me know after you talk to HR   Please schedule a Follow-up Appointment to: Return if symptoms worsen or fail to improve.  If you have any other questions or concerns, please feel free to call the office or send a message through MyChart. You may also schedule an earlier appointment if necessary.  Additionally, you may be receiving a survey about your experience at our office within a few days to 1 week by e-mail or mail. We value your feedback.  Marsa Officer, DO [facility], [place]

## 2024-03-14 ENCOUNTER — Ambulatory Visit: Payer: BC Managed Care – PPO | Admitting: Family Medicine

## 2024-03-14 ENCOUNTER — Ambulatory Visit: Admitting: Family Medicine

## 2024-03-18 DIAGNOSIS — M5441 Lumbago with sciatica, right side: Secondary | ICD-10-CM | POA: Diagnosis not present

## 2024-03-18 DIAGNOSIS — G8929 Other chronic pain: Secondary | ICD-10-CM | POA: Diagnosis not present

## 2024-03-28 ENCOUNTER — Inpatient Hospital Stay
Admission: RE | Admit: 2024-03-28 | Discharge: 2024-03-28 | Disposition: A | Discharge status: Home / Self Care | Payer: Self-pay | Source: Ambulatory Visit | Attending: Neurosurgery | Admitting: Neurosurgery

## 2024-03-28 ENCOUNTER — Other Ambulatory Visit: Payer: Self-pay | Admitting: Family Medicine

## 2024-03-28 DIAGNOSIS — Z049 Encounter for examination and observation for unspecified reason: Secondary | ICD-10-CM
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
## 2024-04-08 DIAGNOSIS — G8929 Other chronic pain: Secondary | ICD-10-CM | POA: Diagnosis not present

## 2024-04-08 DIAGNOSIS — M5441 Lumbago with sciatica, right side: Secondary | ICD-10-CM | POA: Diagnosis not present

## 2024-04-09 ENCOUNTER — Ambulatory Visit: Admitting: Neurosurgery

## 2024-04-09 ENCOUNTER — Encounter: Payer: Self-pay | Admitting: Neurosurgery

## 2024-04-09 VITALS — BP 164/90 | Ht 69.0 in | Wt 283.0 lb

## 2024-04-09 DIAGNOSIS — G894 Chronic pain syndrome: Secondary | ICD-10-CM | POA: Diagnosis not present

## 2024-04-09 DIAGNOSIS — G8929 Other chronic pain: Secondary | ICD-10-CM

## 2024-04-09 DIAGNOSIS — M51361 Other intervertebral disc degeneration, lumbar region with lower extremity pain only: Secondary | ICD-10-CM

## 2024-04-09 DIAGNOSIS — M51362 Other intervertebral disc degeneration, lumbar region with discogenic back pain and lower extremity pain: Secondary | ICD-10-CM

## 2024-04-10 ENCOUNTER — Encounter: Payer: Self-pay | Admitting: Family Medicine

## 2024-04-10 ENCOUNTER — Encounter: Payer: Self-pay | Admitting: Neurosurgery

## 2024-04-10 NOTE — Patient Instructions (Signed)

## 2024-04-11 DIAGNOSIS — M6281 Muscle weakness (generalized): Secondary | ICD-10-CM | POA: Diagnosis not present

## 2024-04-11 DIAGNOSIS — G8929 Other chronic pain: Secondary | ICD-10-CM | POA: Diagnosis not present

## 2024-04-11 DIAGNOSIS — M5441 Lumbago with sciatica, right side: Secondary | ICD-10-CM | POA: Diagnosis not present

## 2024-04-22 DIAGNOSIS — G8929 Other chronic pain: Secondary | ICD-10-CM | POA: Diagnosis not present

## 2024-04-22 DIAGNOSIS — M5441 Lumbago with sciatica, right side: Secondary | ICD-10-CM | POA: Diagnosis not present

## 2024-04-23 ENCOUNTER — Encounter: Payer: Self-pay | Admitting: Neurosurgery

## 2024-04-23 NOTE — Telephone Encounter (Signed)
 I spoke with the patient and the information has been faxed to [facility] at [phone] with the claim number: [id number]

## 2024-05-06 ENCOUNTER — Encounter: Payer: Self-pay | Admitting: Neurosurgery

## 2024-05-06 DIAGNOSIS — G8929 Other chronic pain: Secondary | ICD-10-CM | POA: Diagnosis not present

## 2024-05-06 DIAGNOSIS — G894 Chronic pain syndrome: Secondary | ICD-10-CM

## 2024-05-06 DIAGNOSIS — M5441 Lumbago with sciatica, right side: Secondary | ICD-10-CM | POA: Diagnosis not present

## 2024-05-06 DIAGNOSIS — M6281 Muscle weakness (generalized): Secondary | ICD-10-CM | POA: Diagnosis not present

## 2024-05-09 ENCOUNTER — Encounter: Payer: Self-pay | Admitting: Neurosurgery

## 2024-05-15 ENCOUNTER — Ambulatory Visit
Admission: RE | Admit: 2024-05-15 | Discharge: 2024-05-15 | Disposition: A | Discharge status: Home / Self Care | Source: Ambulatory Visit | Attending: Neurosurgery | Admitting: Neurosurgery

## 2024-05-15 DIAGNOSIS — G894 Chronic pain syndrome: Secondary | ICD-10-CM

## 2024-05-27 ENCOUNTER — Encounter: Payer: Self-pay | Admitting: Neurosurgery

## 2024-05-29 ENCOUNTER — Ambulatory Visit: Attending: Pain Medicine | Admitting: Pain Medicine

## 2024-05-29 VITALS — BP 167/97 | HR 101 | Temp 98.4°F | Resp 16 | Ht 69.0 in | Wt 280.0 lb

## 2024-05-29 DIAGNOSIS — R937 Abnormal findings on diagnostic imaging of other parts of musculoskeletal system: Secondary | ICD-10-CM | POA: Diagnosis not present

## 2024-05-29 NOTE — Patient Instructions (Signed)

## 2024-05-29 NOTE — Progress Notes (Signed)
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 to slow the progression of degenerative changes.  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 He was advised on lifestyle modifications to reduce stress on facet joints, including avoiding heavy lifting and maintaining a healthy BMI.  Obesity as a risk factor for spine degeneration   Obesity is identified as a risk factor for spine degeneration, contributing to chronic back pain and radiculopathy. Weight management strategies were advised to reduce BMI and slow the progression of spine degeneration.  Planned spinal cord stimulator trial for pain management    A spinal cord stimulator trial is planned to assess efficacy in managing chronic low back pain and radiculopathy. The trial involves temporary electrode placement to evaluate pain relief and functional improvement. No contraindications were identified based on medical psychology evaluation. The trial allows for assessment of device efficacy before permanent implantation, with feedback guiding permanent device placement if successful. The spinal cord stimulator trial has been ordered, and instructions for trial preparation were provided, including taking blood pressure medication on the day of the procedure and avoiding infections prior to the trial. The trial procedure is scheduled pending insurance approval.       Lab Orders  No laboratory test(s) ordered today   Imaging Orders  No imaging studies ordered today  Referral Orders  No referral(s) requested today   Procedure Orders    No procedure(s) ordered today   Pharmacotherapy (current): Medications ordered:  No orders of the defined types were placed in this encounter.  Medications administered during this visit: Ricahrd Schwager. Azevedo had no medications administered during this visit.   Analgesic Pharmacotherapy:  Opioid Analgesics: For patients currently taking or requesting to take opioid analgesics, in accordance with [place]  Medical Board Guidelines, we will assess their risks and indications for the use of these substances. After completing our evaluation, we may offer recommendations, but we no longer take patients for medication management. The prescribing physician will ultimately decide, based on his/her training and level of comfort whether to adopt any of the recommendations, including whether or not to prescribe such medicines.  Membrane stabilizer: To be determined at a later time  Muscle relaxant: To be determined at a later time  NSAID: To be determined at a later time  Other analgesic(s): To be determined at a  later time   Interventional management options: Mr. Boldman was informed that there is no guarantee that he would be a candidate for interventional therapies. The decision will be based on the results of diagnostic studies, as well as Mr. Elmore risk profile.  Procedure(s) under consideration:  Pending results of ordered studies     Interventional Therapies  Risk Factors  Considerations  Medical Comorbidities:     Planned  Pending:      Under consideration:   Pending   Completed: (Analgesic benefit)1  None at this time   Therapeutic  Palliative (PRN) options:   None established   Completed by other providers:   None reported  1(Analgesic benefit): Expressed in percentage (%). (Local anesthetic[LA] +/- sedation  L.A.Local Anesthetic  Steroid benefit  Ongoing benefit)   Provider-requested follow-up: Return for (Clinic):(B) L-SCS Trial ([employer]).  No future appointments.  I discussed the assessment and treatment plan with the patient. The patient was provided an opportunity to ask questions and all were answered. The patient agreed with the plan and demonstrated an understanding of the instructions.  Patient advised to call back or seek an in-person evaluation if the symptoms or condition worsens.  Duration of encounter: 45 minutes.  Total time on encounter, as per AMA guidelines included both the face-to-face and non-face-to-face time personally spent by the physician and/or other qualified health care professional(s) on the day of the encounter (includes time in activities that require the physician or other qualified health care professional and does not include time in activities normally performed by clinical staff). Physician's time may include the following activities when performed: Preparing to see the patient (e.g., pre-charting review of records, searching for previously ordered imaging, lab work, and nerve conduction tests) Review of prior analgesic  pharmacotherapies. Reviewing PMP Interpreting ordered tests (e.g., lab work, imaging, nerve conduction tests) Performing post-procedure evaluations, including interpretation of diagnostic procedures Obtaining and/or reviewing separately obtained history Performing a medically appropriate examination and/or evaluation Counseling and educating the patient/family/caregiver Ordering medications, tests, or procedures Referring and communicating with other health care professionals (when not separately reported) Documenting clinical information in the electronic or other health record Independently interpreting results (not separately reported) and communicating results to the patient/ family/caregiver Care coordination (not separately reported)  Note by: Eric DELENA Como, MD (TTS and AI technology used. I apologize for any typographical errors that were not detected and corrected.) Date: 05/29/2024; Time: 10:32 AM

## 2024-06-03 ENCOUNTER — Encounter: Payer: Self-pay | Admitting: Neurosurgery

## 2024-06-04 ENCOUNTER — Encounter: Payer: Self-pay | Admitting: Family Medicine

## 2024-06-04 NOTE — Telephone Encounter (Signed)
-----   Message from Kyle Miles sent at 06/03/2024  4:43 PM EST ----- Kyle Miles to extend his work note for another 4 weeks.  After that hopefully Kyle Miles team can help with that if they are working on scheduling him for a stimulator trial ----- Message ----- From: Kyle Miles, CMA Sent: 06/03/2024   4:18 PM EST To: Kyle LELON Sharps, Kyle Miles; Kyle Miles, CMA  This message was send to you in a Mychart message along with a Long term disability request. I sent him a mychart message back informing him that we do not do long term disability papers. We will only to papers for up to 12 weeks.  Do you want to extend his work note longer? Or do you think he should get a note from Kyle Miles?   Hello, Kyle Miles had put me out of work until November 24th. I will have not had the spinal cord stimulator procedure by then; therefore, I will need another work note stating that I will be out longer than the November24th date. On November 13th I had a consultation visit with Kyle Miles (Interventional Pain Management Specialist). I was told that it will take about one to two weeks before they schedule the spinal cord stimulator trial procedure (they have to reach out to my insurance company first).

## 2024-06-11 ENCOUNTER — Ambulatory Visit: Payer: Self-pay | Admitting: Neurosurgery

## 2024-06-23 NOTE — Progress Notes (Unsigned)
 PROVIDER NOTE: Interpretation of information contained herein should be left to medically-trained personnel. Specific patient instructions are provided elsewhere under Patient Instructions section of medical record. This document was created in part using STT-dictation technology, any transcriptional errors that may result from this process are unintentional.  Patient: Kyle Miles Type: Established DOB: 10-Aug-1970 MRN: [id number] PCP: Edman Marsa PARAS, DO  Service: Procedure DOS: 06/24/2024 Setting: Ambulatory Location: Ambulatory outpatient facility Delivery: Face-to-face Provider: Eric DELENA Como, MD Specialty: Interventional Pain Management Specialty designation: 09 Location: Outpatient facility Ref. Prov.: Edman Marsa *       Interventional Therapy   Primary Reason for Admission: Surgical management of chronic pain condition.   Procedure:              Type: Trial Spinal Cord Neurostimulator Implant (Percutaneous, interlaminar, posterior epidural placement) Laterality: Bilateral (-50)  Level: Lumbar  Imaging: Fluoroscopic guidance Anesthesia: Local anesthesia (1-2% Lidocaine ) Anxiolysis: IV Versed   2.0 mg           Sedation: Minimal Sedation None required. No Fentanyl  administered.         DOS: 06/24/2024  Performed by: Eric DELENA Como, MD  Purpose: Diagnostic. To determine if a permanent implant may be effective in controlling some or all of Mr. Cotta chronic pain symptoms.  Indications: Chronic low back pain and lower extremity pain severe enough to impact quality of life or function. Rationale (medical necessity): procedure needed and proper for the diagnosis and/or treatment of Mr. Duffner medical symptoms and needs. 1. Chronic low back pain (1ry area of Pain) (Bilateral) (R>L) w/o sciatica   2. Chronic lower extremity pain (2ry area of Pain) (Right)   3. Low back pain of over 3 months duration   4. Degeneration of intervertebral disc of  lumbar region, unspecified whether pain present   5. Lumbar facet arthropathy   6. Lumbar facet joint pain   7. Multifactorial low back pain   8. Abnormal MRI, thoracic spine (05/21/2024)    NAS-11 Pain score:   Pre-procedure: 7 /10   Post-procedure: 0-No pain/10     Target: Posterior epidural space over the dorsal columns of the spinal cord. Location: Posterior intraspinal canal Region: Thoracolumbar  Approach: Translaminar percutaneous  Type of procedure: Surgical   Position / Prep / Materials:  Position: Prone  Prep solution: ChloraPrep (2% chlorhexidine gluconate and 70% isopropyl alcohol) Prep Area: Entire Posterior Thoracolumbar Region  Materials:  Tray: Implant tray (Medtronic) Needle(s):  Type: Epidural  Gauge (G): 17  Length: 3.5-in  Qty: 2  H&P (Pre-op Assessment):  Mr. Birdsell is a 53 y.o. (year old), male patient, seen today for interventional treatment. He  has no past surgical history on file.  Initial Vital Signs:  Pulse/EKG Rate: 91ECG Heart Rate: 81 Temp: 98.8 F (37.1 C) Resp: 16 BP: (!) 178/97 SpO2: 97 %  BMI: Estimated body mass index is 40.61 kg/m as calculated from the following:   Height as of this encounter: 5' 9 (1.753 m).   Weight as of this encounter: 275 lb (124.7 kg).  Risk Assessment: Allergies: Reviewed. He has no known allergies.  Allergy Precautions: None required Coagulopathies: Reviewed. None identified.  Blood-thinner therapy: None at this time Active Infection(s): Reviewed. None identified. Mr. Justo is afebrile  Site Confirmation: Mr. Bleicher was asked to confirm the procedure and laterality before marking the site, which he did. Procedure checklist: Completed Consent: Before the procedure and under the influence of no sedative(s), amnesic(s), or anxiolytics, the patient was informed of the  treatment options, risks and possible complications. To fulfill our ethical and legal obligations, as recommended by the American Medical  Association's Code of Ethics, I have informed the patient of my clinical impression; the nature and purpose of the treatment or procedure; the risks, benefits, and possible complications of the intervention; the alternatives, including doing nothing; the risk(s) and benefit(s) of the alternative treatment(s) or procedure(s); and the risk(s) and benefit(s) of doing nothing.  Mr. Dauber was provided with information about the general risks and possible complications associated with most interventional procedures. These include, but are not limited to: failure to achieve desired goals, infection, bleeding, organ or nerve damage, allergic reactions, paralysis, and/or death.  In addition, he was informed of those risks and possible complications associated to this particular procedure, which include, but are not limited to: damage to the implant; failure to decrease pain; local, systemic, or serious CNS infections, intraspinal abscess with possible cord compression and paralysis, or life-threatening such as meningitis; intrathecal and/or epidural bleeding with formation of hematoma with possible spinal cord compression and permanent paralysis; organ damage; nerve injury or damage with subsequent sensory, motor, and/or autonomic system dysfunction, resulting in transient or permanent pain, numbness, and/or weakness of one or several areas of the body; allergic reactions, either minor or major life-threatening, such as anaphylactic or anaphylactoid reactions.  Furthermore, Mr. Turnage was informed of those risks and complications associated with the medications. These include, but are not limited to: allergic reactions (i.e.: anaphylactic or anaphylactoid reactions); arrhythmia;  Hypotension/hypertension; cardiovascular collapse; respiratory depression and/or shortness of breath; swelling or edema; medication-induced neural toxicity; particulate matter embolism and blood vessel occlusion with resultant organ, and/or  nervous system infarction and permanent paralysis.  Finally, he was informed that Medicine is not an exact science; therefore, there is also the possibility of unforeseen or unpredictable risks and/or possible complications that may result in a catastrophic outcome. The patient indicated having understood very clearly. We have given the patient no guarantees and we have made no promises. Enough time was given to the patient to ask questions, all of which were answered to the patient's satisfaction. Mr. Teo has indicated that he wanted to continue with the procedure. Attestation: I, the ordering provider, attest that I have discussed with the patient the benefits, risks, side-effects, alternatives, likelihood of achieving goals, and potential problems during recovery for the procedure that I have provided informed consent. Date  Time: 06/24/2024  8:24 AM  Pre-Procedure Preparation:  Monitoring: As per clinic protocol. Respiration, ETCO2, SpO2, BP, heart rate and rhythm monitor placed and checked for adequate function Safety Precautions: Patient was assessed for positional comfort and pressure points before starting the procedure. Time-out: I initiated and conducted the Time-out before starting the procedure, as per protocol. The patient was asked to participate by confirming the accuracy of the Time Out information. Verification of the correct person, site, and procedure were performed and confirmed by me, the nursing staff, and the patient. Time-out conducted as per Joint Commission's Universal Protocol (UP.01.01.01). Time: 0926 Start Time: 0926 hrs.  Description/Narrative of Procedure:          Rationale (medical necessity): procedure needed and proper for the diagnosis and/or treatment of the patient's medical symptoms and needs. Procedural Technique Safety Precautions: Aspiration looking for blood return was conducted prior to all injections. At no point did we inject any substances, as a  needle was being advanced. No attempts were made at seeking any paresthesias. Safe injection practices and needle disposal techniques used. Medications  properly checked for expiration dates. SDV (single dose vial) medications used. Description of the Procedure: Protocol guidelines were followed. The patient was assisted into a comfortable position. The target area was identified and the area prepped in the usual manner. Skin & deeper tissues infiltrated with local anesthetic. Appropriate amount of time allowed to pass for local anesthetics to take effect. The procedure needles were then advanced to the target area. Proper needle placement secured. Negative aspiration confirmed. Solution injected in intermittent fashion, asking for systemic symptoms every 0.5cc of injectate. The needles were then removed and the area cleansed, making sure to leave some of the prepping solution back to take advantage of its long term bactericidal properties.  Technical description of procedure: Availability of a responsible, adult driver, and NPO status confirmed. Informed consent was obtained after having discussed risks and possible complications. An IV was started. The patient was then taken to the fluoroscopy suite, where the patient was placed in position for the procedure, over the fluoroscopy table. The patient was then monitored in the usual manner. Fluoroscopy was manipulated to obtain the best possible view of the target. Parallex error was corrected before commencing the procedure. Once a clear view of the target had been obtained, the skin and deeper tissues over the procedure site were infiltrated using lidocaine , loaded in a 10 cc luer-loc syringe with a 0.5 inch, 25-G needle. The introducer needle(s) was/were then inserted through the skin and deeper tissues. A paramidline approach was used to enter the posterior epidural space at a 30 angle, using "Loss-of-resistance Technique" with 3 ml of PF-NaCl (0.9% NSS).  Correct needle placement was confirmed in the antero-posterior and lateral fluoroscopic views. The lead was gently introduced and manipulated under real-time fluoroscopy, constantly assessing for pain, discomfort, or paresthesias, until the tip rested at the desired level. Both sides were done in identical fashion. Electrode placement was tested until appropriate coverage was attained. Once the patient confirmed that the stimulation was over the desired area, the lead(s) was/were secured in place and the introducer needles removed. This was done under real-time fluoroscopy while observing the electrode tip to avoid unintended migration. The area was covered with a non-occlusive dressing and the patient transported to recovery for further programming.  Vitals:   06/24/24 1018 06/24/24 1025 06/24/24 1035 06/24/24 1047  BP: (!) 152/108 (!) 162/97 (!) 156/100 (!) 147/85  Pulse:      Resp: 20 16 (!) 21 15  Temp:  98.3 F (36.8 C)  98.1 F (36.7 C)  SpO2: 100% 99% 97% 98%  Weight:      Height:        Start Time: 0926 hrs. End Time: 1017 hrs.  Neurostimulator Details:  Lead(s):  Brand: Medtronic         Epidural Access Level:  L1-2 L1-2  Lead implant:  Bilateral   No. of Electrodes/Lead:  8 8  Laterality:  Left Right  Location (Top electrode):  T8 T7-8  Location (Lower electrode):  T10 T9-10  Model No.: [id number] Same  Length: 60 cm Same  Lot No.: [id number]  MRI compatibility:  Conditional           External Neurostimulator    Model No.: [id number]   Serial No.: [id number]    Imaging Guidance (Spinal):         Type of Imaging Technique: Fluoroscopy Guidance (Spinal) Indication(s): Fluoroscopy guidance for needle placement to enhance accuracy in procedures requiring precise needle localization for targeted delivery of medication in or near  specific anatomical locations not easily accessible without such real-time imaging assistance. Exposure Time: Please see nurses  notes. Contrast: None used. Fluoroscopic Guidance: I was personally present during the use of fluoroscopy. Tunnel Vision Technique used to obtain the best possible view of the target area. Parallax error corrected before commencing the procedure. Direction-depth-direction technique used to introduce the needle under continuous pulsed fluoroscopy. Once target was reached, antero-posterior, oblique, and lateral fluoroscopic projection used confirm needle placement in all planes. Images permanently stored in EMR.            Interpretation: No contrast injected. I personally interpreted the imaging intraoperatively. Adequate needle placement confirmed in multiple planes. Permanent images saved into the patient's record.  Antibiotic Prophylaxis:   Anti-infectives (From admission, onward)    Start     Dose/Rate Route Frequency Ordered Stop   06/24/24 0830  ceFAZolin  (ANCEF ) IVPB 1 g/50 mL premix        1 g 100 mL/hr over 30 Minutes Intravenous  Once 06/24/24 0826 06/24/24 0916   06/24/24 0000  cephALEXin  (KEFLEX ) 500 MG capsule       Note to Pharmacy: For implant surgical prophylaxis. Instruct to take 1st dose 8 hrs after surgery. May substitute for generic if available.   500 mg Oral 2 times daily 06/24/24 0822 07/09/24 2359      Indication(s): None identified  Post-operative Assessment:  Post-procedure Vital Signs:  Pulse/HCG Rate: 9180 Temp: 98.1 F (36.7 C) Resp: 15 BP: (!) 147/85 SpO2: 98 %  Complications: No immediate post-treatment complications observed by team, or reported by patient.  Note: The patient tolerated the entire procedure well. A repeat set of vitals were taken after the procedure and the patient was kept under observation following institutional policy, for this type of procedure. Post-procedural neurological assessment was performed, showing return to baseline, prior to discharge. The patient was provided with post-procedure discharge instructions, including  a section on how to identify potential problems. Should any problems arise concerning this procedure, the patient was given instructions to immediately contact us , at any time, without hesitation. In any case, we plan to contact the patient by telephone for a follow-up status report regarding this interventional procedure.  Comments:  No additional relevant information.  Plan of Care Orders:  Orders Placed This Encounter  Procedures   Brady TRIAL    Contact medical implant company representative to make sure they are available to provide required equipment.    Scheduling Instructions:     Side: Bilateral     Level: Lumbar     Device: Medtronic     Sedation: With sedation     Timeframe: 06/24/2024    Where will this procedure be performed?:   [facility]   [facility] C-ARM 1-60 MIN NO REPORT    Intraoperative interpretation by procedural physician at [facility].    Standing Status:   Standing    Number of Occurrences:   1    Reason for exam::   Assistance in needle guidance and placement for procedures requiring needle placement in or near specific anatomical locations not easily accessible without such assistance.   Informed Consent Details: Physician/Practitioner Attestation; Transcribe to consent form and obtain patient signature    Note: Always confirm laterality of pain with Mr. Mcclish, before procedure. Transcribe to consent form and obtain patient signature.    Scheduling Instructions:     CPT Code: PERCUTANEOUS IMPLANTATION OF NEUROSTIMULATOR ELECTRODE ARRAY, EPIDURAL (36349)     ICD-10-CM Support Codes:  Physician/Practitioner attestation of informed consent for procedure/surgical case:   I, the physician/practitioner, attest that I have discussed with the patient the benefits, risks, side effects, alternatives, likelihood of achieving goals and potential problems during recovery for the procedure that I have provided informed consent.    Procedure:    Neurostimulator Trial    Physician/Practitioner performing the procedure:   Jyles Sontag A. Jb Dulworth, MD    Indication/Reason:   Regional Chronic Pain Syndrome   Follow-up    Post-procedure Phone Call: Call patient tomorrow for routine early follow-up evaluation.  Return Appointment Timeframe: Approximately 6-7 days, to remove Trial leads.    Standing Status:   Standing    Number of Occurrences:   1    Specify:   Schedule a return appointment for post-procedure evaluation. In addition arrange for patient to receive a follow-up phone call tomorrow to assess post-procedure status.   Provide equipment / supplies at bedside    Procedure tray: Epidural Tray (x1) Additional material(s):  1. Sterile towel pack (x2) 2. Small streight hemostat (x2) 3. Large hemostat (x1) 4. Sutures (cutting needle) 0-silk (x1) 5. Needle holder (x1) 6. Scissors (Mayo) (x1) 7. 3/4 Steri-strip pack (x2) 8. 4x4 Gauze Pack (x1) 9. Large sterile transparent dressings (Tegaderm) (x1)    Standing Status:   Standing    Number of Occurrences:   1    Specify:   Epidural Tray & Minor Surgery Tray     Opioid Analgesic: None MME/day: 0 mg/day    Medications administered: We administered lactated ringers , ceFAZolin , lidocaine , ropivacaine  (PF) 2 mg/mL (0.2%), sodium chloride  flush, pentafluoroprop-tetrafluoroeth, and midazolam .  See the medical record for exact dosing, route, and time of administration.    Interventional Therapies  Risk Factors  Considerations  Medical Comorbidities:     Planned  Pending:      Under consideration:   Pending   Completed: (Analgesic benefit)1  None at this time   Therapeutic  Palliative (PRN) options:   None established   Completed by other providers:   None reported  1(Analgesic benefit): Expressed in percentage (%). (Local anesthetic[LA] +/- sedation  L.A.Local Anesthetic  Steroid benefit  Ongoing benefit)    Follow-up plan:   Return in about 1 week (around  07/01/2024) for (Face2F), (PPE) Removal of SCS trial leads (2).     Recent Visits Date Type Provider Dept  05/29/24 Office Visit Tanya Glisson, MD [facility]  Showing recent visits within past 90 days and meeting all other requirements Today's Visits Date Type Provider Dept  06/24/24 Procedure visit Tanya Glisson, MD [facility]  Showing today's visits and meeting all other requirements Future Appointments Date Type Provider Dept  06/30/24 Appointment Tanya Glisson, MD [facility]  Showing future appointments within next 90 days and meeting all other requirements  Disposition: Discharge home  Discharge (Date  Time): 06/24/2024; 1048 hrs.   Primary Care Physician: Edman Marsa PARAS, DO Location: [facility] Note by: Glisson DELENA Tanya, MD (TTS technology used. I apologize for any typographical errors that were not detected and corrected.) Date: 06/24/2024; Time: 11:53 AM

## 2024-06-24 ENCOUNTER — Encounter: Payer: Self-pay | Admitting: Pain Medicine

## 2024-06-24 ENCOUNTER — Ambulatory Visit: Admitting: Pain Medicine

## 2024-06-24 ENCOUNTER — Ambulatory Visit
Admission: RE | Admit: 2024-06-24 | Discharge: 2024-06-24 | Disposition: A | Source: Ambulatory Visit | Attending: Pain Medicine | Admitting: Pain Medicine

## 2024-06-24 VITALS — BP 147/85 | HR 91 | Temp 98.1°F | Resp 15 | Ht 69.0 in | Wt 275.0 lb

## 2024-06-24 DIAGNOSIS — M51369 Other intervertebral disc degeneration, lumbar region without mention of lumbar back pain or lower extremity pain: Secondary | ICD-10-CM | POA: Diagnosis not present

## 2024-06-24 DIAGNOSIS — Z792 Long term (current) use of antibiotics: Secondary | ICD-10-CM | POA: Diagnosis not present

## 2024-06-24 DIAGNOSIS — M5459 Other low back pain: Secondary | ICD-10-CM | POA: Insufficient documentation

## 2024-06-24 DIAGNOSIS — M545 Low back pain, unspecified: Secondary | ICD-10-CM | POA: Diagnosis not present

## 2024-06-24 DIAGNOSIS — M47816 Spondylosis without myelopathy or radiculopathy, lumbar region: Secondary | ICD-10-CM | POA: Diagnosis not present

## 2024-06-24 DIAGNOSIS — R937 Abnormal findings on diagnostic imaging of other parts of musculoskeletal system: Secondary | ICD-10-CM | POA: Diagnosis not present

## 2024-06-24 DIAGNOSIS — M79604 Pain in right leg: Secondary | ICD-10-CM | POA: Diagnosis not present

## 2024-06-24 DIAGNOSIS — G8929 Other chronic pain: Secondary | ICD-10-CM | POA: Insufficient documentation

## 2024-06-24 MED ORDER — LACTATED RINGERS IV SOLN: Freq: Once | INTRAVENOUS | Status: AC

## 2024-06-24 MED ORDER — CEFAZOLIN SODIUM-DEXTROSE 1-4 GM/50ML-% IV SOLN
1.0000 g | Freq: Once | INTRAVENOUS | Status: AC
  Administered 2024-06-24: 1 g via INTRAVENOUS

## 2024-06-24 MED ORDER — SODIUM CHLORIDE (PF) 0.9 % IJ SOLN
INTRAMUSCULAR | Status: AC
  Filled 2024-06-24: qty 10

## 2024-06-24 MED ORDER — LIDOCAINE HCL 2 % IJ SOLN
INTRAMUSCULAR | Status: AC
  Filled 2024-06-24: qty 20

## 2024-06-24 MED ORDER — CEFAZOLIN SODIUM 1 G IJ SOLR
INTRAMUSCULAR | Status: AC
  Filled 2024-06-24: qty 10

## 2024-06-24 MED ORDER — FENTANYL CITRATE (PF) 100 MCG/2ML IJ SOLN: 25.0000 ug | INTRAMUSCULAR | Status: DC | PRN

## 2024-06-24 MED ORDER — MIDAZOLAM BOLUS VIA INFUSION: 1.0000 mg | INTRAVENOUS | Status: DC | PRN

## 2024-06-24 MED ORDER — CEPHALEXIN 500 MG PO CAPS: 500.0000 mg | ORAL_CAPSULE | Freq: Two times a day (BID) | ORAL | 0 refills | Status: AC

## 2024-06-24 MED ORDER — FENTANYL CITRATE (PF) 100 MCG/2ML IJ SOLN
INTRAMUSCULAR | Status: AC
  Filled 2024-06-24: qty 2

## 2024-06-24 MED ORDER — SODIUM CHLORIDE 0.9% FLUSH
10.0000 mL | Freq: Once | INTRAVENOUS | Status: AC
  Administered 2024-06-24: 10 mL

## 2024-06-24 MED ORDER — LIDOCAINE HCL 2 % IJ SOLN
20.0000 mL | Freq: Once | INTRAMUSCULAR | Status: AC
  Administered 2024-06-24: 400 mg

## 2024-06-24 MED ORDER — MIDAZOLAM HCL 5 MG/5ML IJ SOLN
0.5000 mg | Freq: Once | INTRAMUSCULAR | Status: AC
  Administered 2024-06-24: 2 mg via INTRAVENOUS

## 2024-06-24 MED ORDER — PENTAFLUOROPROP-TETRAFLUOROETH EX AERO
INHALATION_SPRAY | Freq: Once | CUTANEOUS | Status: AC
  Administered 2024-06-24: 30 via TOPICAL

## 2024-06-24 MED ORDER — ROPIVACAINE HCL 2 MG/ML IJ SOLN
INTRAMUSCULAR | Status: AC
  Filled 2024-06-24: qty 20

## 2024-06-24 MED ORDER — MIDAZOLAM HCL 5 MG/5ML IJ SOLN
INTRAMUSCULAR | Status: AC
  Filled 2024-06-24: qty 5

## 2024-06-24 MED ORDER — ROPIVACAINE HCL 2 MG/ML IJ SOLN
20.0000 mL | Freq: Once | INTRAMUSCULAR | Status: AC
  Administered 2024-06-24: 20 mL

## 2024-06-24 NOTE — Patient Instructions (Addendum)
  ___________________________________________________________________________________________  PATIENT DISCHARGE INSTRUCTIONS FOLLOWING SPINAL CORD STIMULATOR TRIAL IMPLANT  You will be discharged from the clinic on the same day as your surgery, after you are fully recovered. Make arrangements to be driven home. DO NOT DRIVE YOURSELF!!  The bandage over the side incision may drain a small amount of blood. This is normal; don't be alarmed. Be certain to review all warnings/precautions in the patient brochure accompanying your stimulator. Please call the Pain Clinic to make an appointment for follow?up care.  Instructions: Food intake: Start with clear liquids (like water) and advance to regular food, as tolerated.  Physical activities:  No restrictions during the trial period except for submerging in water above the implant area. Driving: You can start to drive again when you are fully recovered from the effects of the sedation (24 hours). Blood thinner: If you take a blood thinner, restart it 6 hours after your procedure. (Only for those taking blood thinners) Insulin : If you use insulin , as soon as you can eat, you may resume your normal dosing schedule. (Only for those taking insulin ) Wound dressing care: Keep procedure site clean and dry. Clean wound with alcohol, once a day for the first 7 days, starting 48 hours after the surgery.  Keep incisions clean and dry. Cover with plastic wrap and tape when showering. Do not submerge incisions in a bathtub, pool or spa. Follow-up appointment: Keep your follow-up appointment after the procedure. Usually 6-7 days to remove trial leads.   Expect: From numbing medicine (AKA: Local Anesthetics): Numbness or decrease in pain. Duration: On the average, 1 to 8 hours.  From procedure: Some discomfort is to be expected once the numbing medicine wears off. This should be minimal. Use your regular pain medicines.   Call if: You experience numbness and  weakness that gets worse with time, as opposed to wearing off. New onset bowel or bladder incontinence. develop a temperature over 101.5? are unable to urinate have increasing difficulty walking or using your legs have incisional pain that continues to increase rather than stabilize or improve have any drainage of pus from your incisions have drainage which completely saturates the bandage  Emergency Numbers: Durning business hours (Monday - Thursday, 8:00 AM - 4:00 PM) (Friday, 9:00 AM - 12:00 Noon): [phone] After hours: [phone] ____________________________________________________________________________________________   ______________________________________________________________________    Muscle Spasms & Cramps  Cause(s):  Most common - vitamin and/or electrolyte (calcium, potassium, sodium, etc.) deficiencies. Post procedure - steroids (injected, oral, or inhaled) can make your kidneys excrete (loose) electrolytes. Most of the time this will not cause any symptoms however, if you happen to be borderline low on your electrolytes, it may temporarily triggering cramps & spasms.  Possible triggers: Sweating - causes loss of electrolytes thru the skin. Steroids - causes loss of electrolytes thru the urine.  Treatment: (over-the-counter)  Gatorade (or any other electrolyte-replenishing drink) - Take 1, 8 oz glass with each meal (3 times a day). Mechanism of action: Replenishes lost electrolytes. Magnesium 400 to 500 mg - Take 1 tablet twice a day (one with breakfast and one at bedtime). If you have kidney disease talk to your primary care physician before taking any Magnesium. Mechanism of action: Magnesium is a natural muscle relaxant. Tonic Water with quinine - Take 1, 8 oz glass before bedtime.  Mechanism of action: Quinine is used to treat spasms.  Last Update: 01/25/2023  ______________________________________________________________________

## 2024-06-24 NOTE — Progress Notes (Signed)
 Safety precautions to be maintained throughout the outpatient stay will include: orient to surroundings, keep bed in low position, maintain call bell within reach at all times, provide assistance with transfer out of bed and ambulation.

## 2024-06-25 ENCOUNTER — Telehealth: Payer: Self-pay | Admitting: *Deleted

## 2024-06-25 ENCOUNTER — Telehealth: Payer: Self-pay | Admitting: Pain Medicine

## 2024-06-25 NOTE — Telephone Encounter (Signed)
 Returned patient phone call to let him know that [facility] is not going to send in muscle relaxer at this time.  Patient verbalizes u/o information.

## 2024-06-25 NOTE — Telephone Encounter (Signed)
 Post procedure call; reports that he had a few muscle spasms that woke him up over night, other than that doing well.  Will f/up with Medtronic throughout the week for programming.

## 2024-06-25 NOTE — Telephone Encounter (Signed)
 PT stated after his procedure he is having muscle spasm and would like muscle relaxer's for it.

## 2024-06-30 ENCOUNTER — Ambulatory Visit: Admitting: Pain Medicine

## 2024-07-01 ENCOUNTER — Encounter: Payer: Self-pay | Admitting: Pain Medicine

## 2024-07-01 ENCOUNTER — Ambulatory Visit: Attending: Pain Medicine | Admitting: Pain Medicine

## 2024-07-01 VITALS — BP 150/100 | HR 88 | Temp 97.2°F | Resp 18 | Ht 69.0 in | Wt 280.0 lb

## 2024-07-01 DIAGNOSIS — Z7189 Other specified counseling: Secondary | ICD-10-CM

## 2024-07-01 DIAGNOSIS — R937 Abnormal findings on diagnostic imaging of other parts of musculoskeletal system: Secondary | ICD-10-CM

## 2024-07-01 DIAGNOSIS — Z4589 Encounter for adjustment and management of other implanted devices: Secondary | ICD-10-CM | POA: Diagnosis not present

## 2024-07-01 DIAGNOSIS — G8929 Other chronic pain: Secondary | ICD-10-CM

## 2024-07-01 DIAGNOSIS — Z9689 Presence of other specified functional implants: Secondary | ICD-10-CM | POA: Insufficient documentation

## 2024-07-01 DIAGNOSIS — M5459 Other low back pain: Secondary | ICD-10-CM

## 2024-07-01 DIAGNOSIS — M545 Low back pain, unspecified: Secondary | ICD-10-CM

## 2024-07-01 DIAGNOSIS — M51369 Other intervertebral disc degeneration, lumbar region without mention of lumbar back pain or lower extremity pain: Secondary | ICD-10-CM

## 2024-07-01 NOTE — Progress Notes (Signed)
 PROVIDER NOTE: Interpretation of information contained herein should be left to medically-trained personnel. Specific patient instructions are provided elsewhere under Patient Instructions section of medical record. This document was created in part using STT-dictation technology, any transcriptional errors that may result from this process are unintentional.  Patient: Kyle Miles Type: Established DOB: [birth year] MRN: [id number] PCP: Edman Marsa PARAS, DO  Service: Procedure DOS: 07/01/2024 Setting: Ambulatory Location: Ambulatory outpatient facility Delivery: Face-to-face Provider: Eric DELENA Como, MD Specialty: Interventional Pain Management Specialty designation: 09 Location: Outpatient facility Ref. Prov.: Edman Marsa PARAS, DO       Interventional Therapy   Primary Reason for Admission: Surgical management of chronic pain condition.  Procedure:  Anesthesia, Analgesia, Anxiolysis:  Type: Removal of Trial Neurostimulator Leads Purpose: End-of-trial Region: Lumbar Laterality: Bilateral   None required   1. Chronic low back pain (1ry area of Pain) (Bilateral) (R>L) w/o sciatica   2. Chronic lower extremity pain (2ry area of Pain) (Right)   3. Degeneration of intervertebral disc of lumbar region, unspecified whether pain present   4. Low back pain of over 3 months duration   5. Lumbar facet joint pain   6. Multifactorial low back pain   7. Abnormal MRI, lumbar spine (02/16/2024)   8. Abnormal MRI, thoracic spine (05/21/2024)   9. S/P insertion of trial spinal cord stimulator    NAS-11 Pain score:   Pre-procedure: 4 /10   Post-procedure: 4 /10   Note: Today's visit was more extensive than expected since they had several questions regarding possible next steps since the device proved to be relatively ineffective in controlling his chronic pain.  He also has multiple questions regarding his job and his apparent inability to perform it.  He has been  recommended to rethink his future alternatives since this condition appears to be chronic and unlikely to significantly improve unless he takes some major steps such as bringing his BMI down to less than 30 kg/m and properly understanding his limitations.  Neuromodulation Implant Therapy Assessment  Epidural Neurostimulator implant: Side-effects or Adverse reactions: None reported Effectiveness: The device was described to be effective only while he was at rest, but it did not help him with the pain while he was doing any type of activity. Plan: Trial leads removed.  Results do not seem to be providing enough benefit to move on with the permanent implant.            H&P (Pre-op Assessment):  [name] is a 53 y.o. (year old), male patient, seen today for removal of neurostimulator trial lead(s). He  has no past surgical history on file.  Initial Vital Signs:  Pulse: 88  Temp: (!) 97.2 F (36.2 C) Resp: 18 BP: (!) 150/100 SpO2: 96 %  BMI: Estimated body mass index is 41.35 kg/m as calculated from the following:   Height as of this encounter: 5' 9 (1.753 m).   Weight as of this encounter: 280 lb (127 kg).  Risk Assessment: Allergies: Reviewed. He has no known allergies.  Allergy Precautions: None required Coagulopathies: Reviewed. None identified.  Blood-thinner therapy: None at this time Active Infection(s): Reviewed. None identified. Kyle Miles is afebrile  Site Confirmation: Kyle Miles was asked to confirm the procedure and laterality before marking the site, which he did. Procedure checklist: Completed Consent: Kyle Miles consents to removal of trial leads. Attestation: I, the ordering provider, attest that I have discussed with the patient the risks and potential problems associated with procedure. Date  Time: 07/01/2024  7:59 AM  Pre-Procedure Preparation:  Monitoring: As per clinic protocol. Respiration, ETCO2, SpO2, BP, heart rate and rhythm monitor placed and  checked for adequate function Safety Precautions: Patient was assessed for positional comfort and pressure points before starting the procedure. Time-out: I initiated and conducted the Time-out before starting the procedure, as per protocol. The patient was asked to participate by confirming the accuracy of the Time Out information. Verification of the correct person, site, and procedure were performed and confirmed by me, the nursing staff, and the patient. Time-out conducted as per Joint Commission's Universal Protocol (UP.01.01.01). Time:   Start Time:   hrs.  Description of Procedure Process:   Position: Sitting Area Prepped: Around implant device site Prepping solution: ChloraPrep (2% chlorhexidine gluconate and 70% isopropyl alcohol) Safety Precautions: Sterile technique used  Description of the Procedure: Availability of a responsible, adult driver, and NPO status confirmed. Informed consent was obtained after having discussed risks and possible complications. An IV was started. The patient was then taken to the fluoroscopy suite, where the patient was placed in position for the procedure, over the fluoroscopy table. The patient was then monitored in the usual manner. The bandages were removed and the surgical area was prepped using a broad-spectrum topical antiseptic. Meaningful verbal contact was maintained with the patient at all times. The stitches were then removed and the patient asked to mildly flex the spine. Using constant tension over the leads, the electrodes were removed in their entirety, without any apparent complication. The patient tolerated the entire procedure well. Following the performance of this procedure, the patient was kept under observation until the discharge criteria were met. The patient was sent home in stable condition. The patient was provided with discharge instructions, including a section on how to identify potential problems. Should any problems arise  concerning this procedure, the patient was given instructions to contact us , without hesitation. In any case, we will contact the patient by telephone for a follow-up status report regarding this interventional procedure.  Vitals:   07/01/24 0758  BP: (!) 150/100  Pulse: 88  Resp: 18  Temp: (!) 97.2 F (36.2 C)  SpO2: 96%  Weight: 280 lb (127 kg)  Height: 5' 9 (1.753 m)   Start Time:   hrs. End Time:   hrs.  Materials: Sterile suture removal kit; Sterile gloves; Sterile gauze; Band-aid  Medication(s): N/A  Post-operative Assessment:  Post-procedure Vital Signs:  Pulse/HCG Rate: 88  Temp: (!) 97.2 F (36.2 C) Resp: 18 BP: (!) 150/100 SpO2: 96 %  Complications: No immediate post-treatment complications observed by team, or reported by patient.  Note: The patient tolerated the entire procedure well. A repeat set of vitals were taken after the procedure and the patient was kept under observation following institutional policy, for this type of procedure. Post-procedural neurological assessment was performed, showing return to baseline, prior to discharge. The patient was provided with post-procedure discharge instructions, including a section on how to identify potential problems. Should any problems arise concerning this procedure, the patient was given instructions to immediately contact us , at any time, without hesitation. In any case, we plan to contact the patient by telephone for a follow-up status report regarding this interventional procedure.  Comments:  No additional relevant information.  Plan of Care Orders:  Orders Placed This Encounter  Procedures   Spinal cord stimulator, removal of trial lead implant   Suture removal kit    Please have suture removal kit available in exam room.    Standing  Status:   Standing    Number of Occurrences:   1   Provide equipment / supplies at bedside    Suture Removal Kit (Disposable  single use)    Standing Status:   Standing     Number of Occurrences:   1    Specify:   Suture Removal Kit   Informed Consent Details: Physician/Practitioner Attestation; Transcribe to consent form and obtain patient signature    Nursing Order: Transcribe to consent form and obtain patient signature. Note: Always confirm laterality of pain with Mr. Clingerman, before procedure.    Physician/Practitioner attestation of informed consent for procedure/surgical case:   I, the physician/practitioner, attest that I have discussed with the patient the benefits, risks, side effects, alternatives, likelihood of achieving goals and potential problems during recovery for the procedure that I have provided informed consent.    Procedure:   Removal of Lumbar Neurostimulator Lead(s)    Physician/Practitioner performing the procedure:   Herve Haug A. Keghan Mcfarren, MD    Indication/Reason:   End of trial     Opioid Analgesic: None MME/day: 0 mg/day    Medications administered: Cordella BIRCH. [place] had no medications administered during this visit.  See the medical record for exact dosing, route, and time of administration.    Interventional Therapies  Risk Factors  Considerations  Medical Comorbidities:     Planned  Pending:      Under consideration:   Reassessment of the patient's chronic pain problem. Possible round of physical therapy.   Completed: (Analgesic benefit)1  Diagnostic bilateral thoracolumbar SCS trial (06/24/2024) (ineffective)  Removal of bilateral thoracolumbar SCS trial leads (07/01/2024)    Therapeutic  Palliative (PRN) options:   None established   Completed by other providers:   None reported  1(Analgesic benefit): Expressed in percentage (%). (Local anesthetic[LA] +/- sedation  L.A.Local Anesthetic  Steroid benefit  Ongoing benefit)    Follow-up plan:   Return in about 1 month (around 08/01/2024) for ( ), Eval-day (M,W), (Face2F), for PExm.     Recent Visits Date Type Provider Dept  06/24/24 Procedure visit  Tanya Glisson, MD [facility]  05/29/24 Office Visit Tanya Glisson, MD [facility]  Showing recent visits within past 90 days and meeting all other requirements Today's Visits Date Type Provider Dept  07/01/24 Procedure visit Tanya Glisson, MD [facility]  Showing today's visits and meeting all other requirements Future Appointments Date Type Provider Dept  08/04/24 Appointment Tanya Glisson, MD [facility]  Showing future appointments within next 90 days and meeting all other requirements  Disposition: Discharge home  Discharge (Date  Time): 07/01/2024; 0811 hrs.   Primary Care Physician: Edman Marsa PARAS, DO Location: [facility] Note by: Glisson DELENA Tanya, MD (TTS technology used. I apologize for any typographical errors that were not detected and corrected.) Date: 07/01/2024; Time: 8:30 AM

## 2024-07-01 NOTE — Progress Notes (Signed)
 Safety precautions to be maintained throughout the outpatient stay will include: orient to surroundings, keep bed in low position, maintain call bell within reach at all times, provide assistance with transfer out of bed and ambulation. Spinal Cord stimulator leads removed by Dr Tanya. Area cleaned with chloraprep.  All leads intact upon removal.

## 2024-08-03 NOTE — Progress Notes (Unsigned)
 PROVIDER NOTE: Interpretation of information contained herein should be left to medically-trained personnel. Specific patient instructions are provided elsewhere under Patient Instructions section of medical record. This document was created in part using AI and STT-dictation technology, any transcriptional errors that may result from this process are unintentional.  Patient: Kyle Miles  Service: E/M   PCP: Edman Marsa PARAS, DO  DOB: [birth year]  DOS: 08/04/2024  Provider: Eric DELENA Como, MD  MRN: [id number]  Delivery: Face-to-face  Specialty: Interventional Pain Management  Type: Established Patient  Setting: Ambulatory outpatient facility  Specialty designation: 09  Referring Prov.: Edman Marsa PARAS, DO  Location: Outpatient office facility       History of present illness (HPI) Kyle Miles, a 54 y.o. year old male, is here today because of his Chronic bilateral low back pain without sciatica [M54.50, G89.29]. Kyle Miles primary complain today is No chief complaint on file.  Pertinent problems: Kyle Miles has Chronic low back pain (1ry area of Pain) (Bilateral) (R>L) w/o sciatica; Chronic neck pain; DDD (degenerative disc disease), cervical; DDD (degenerative disc disease), lumbar; Abnormal MRI, thoracic spine (05/21/2024); Lumbar facet arthropathy; Lumbar facet joint pain; Low back pain of over 3 months duration; Multifactorial low back pain; Chronic lower extremity pain (2ry area of Pain) (Right); Abnormal MRI, lumbar spine (02/16/2024); and S/P insertion of trial spinal cord stimulator on their pertinent problem list.  Pain Assessment: Severity of   is reported as a  /10. Location:    / . Onset:  . Quality:  . Timing:  . Modifying factor(s):  SABRA Vitals:  vitals were not taken for this visit.  BMI: Estimated body mass index is 41.35 kg/m as calculated from the following:   Height as of 07/01/24: 5' 9 (1.753 m).   Weight as of 07/01/24: 280 lb (127  kg).  Last encounter: 05/29/2024. Last procedure: 07/01/2024.  Reason for encounter: evaluation for possible interventional PM therapy/treatment.   Discussed the use of AI scribe software for clinical note transcription with the patient, who gave verbal consent to proceed.  History of Present Illness           Pharmacotherapy Assessment   Opioid Analgesic: None MME/day: 0 mg/day   Monitoring: [place] PMP: PDMP reviewed during this encounter.       Pharmacotherapy: No side-effects or adverse reactions reported. Compliance: No problems identified. Effectiveness: Clinically acceptable.  No notes on file  UDS:  No results found for: SUMMARY  No results found for: CBDTHCR No results found for: D8THCCBX No results found for: D9THCCBX  ROS  Constitutional: Denies any fever or chills Gastrointestinal: No reported hemesis, hematochezia, vomiting, or acute GI distress Musculoskeletal: Denies any acute onset joint swelling, redness, loss of ROM, or weakness Neurological: No reported episodes of acute onset apraxia, aphasia, dysarthria, agnosia, amnesia, paralysis, loss of coordination, or loss of consciousness  Medication Review  Cinnamon, Multi-Vitamin, albuterol , amLODipine , ascorbic acid , cephALEXin , fluticasone , metoprolol  succinate, omega-3 acid ethyl esters, traMADol , and zinc  sulfate (50mg  elemental zinc )  History Review  Allergy: Kyle Miles has no known allergies. Drug: Kyle Miles  reports no history of drug use. Alcohol:  reports no history of alcohol use. Tobacco:  reports that he has never smoked. He has never used smokeless tobacco. Social: Kyle Miles  reports that he has never smoked. He has never used smokeless tobacco. He reports that he does not drink alcohol and does not use drugs. Medical:  has a past medical history of Hypertension and Sleep apnea.  Surgical: Kyle Miles  has no past surgical history on file. Family: family history includes Cancer in his  mother; Heart disease (age of onset: 87) in his father.  Laboratory Chemistry Profile   Renal Lab Results  Component Value Date   BUN 12 09/07/2023   CREATININE 1.13 09/07/2023   BCR SEE NOTE: 09/07/2023   GFRAA 74 08/30/2020   GFRNONAA >60 02/17/2021    Hepatic Lab Results  Component Value Date   AST 16 09/07/2023   ALT 23 09/07/2023   ALBUMIN 3.0 (L) 04/02/2020   ALKPHOS 57 04/02/2020    Electrolytes Lab Results  Component Value Date   NA 142 09/07/2023   K 4.3 09/07/2023   CL 104 09/07/2023   CALCIUM 9.2 09/07/2023   MG 2.4 04/02/2020   PHOS 3.9 04/02/2020    Bone Lab Results  Component Value Date   TESTOFREE 15.2 02/24/2016   TESTOSTERONE  230 (L) 08/23/2021    Inflammation (CRP: Acute Phase) (ESR: Chronic Phase) Lab Results  Component Value Date   CRP 1.5 (H) 04/02/2020         Note: Above Lab results reviewed.  Recent Imaging Review  DG PAIN CLINIC C-ARM 1-60 MIN NO REPORT Fluoro was used, but no Radiologist interpretation will be provided.  Please refer to NOTES tab for provider progress note. Note: Reviewed        Physical Exam  Vitals: There were no vitals taken for this visit. BMI: Estimated body mass index is 41.35 kg/m as calculated from the following:   Height as of 07/01/24: 5' 9 (1.753 m).   Weight as of 07/01/24: 280 lb (127 kg). Ideal: Patient weight not recorded General appearance: Well nourished, well developed, and well hydrated. In no apparent acute distress Mental status: Alert, oriented x 3 (person, place, & time)       Respiratory: No evidence of acute respiratory distress Eyes: PERLA   Assessment   Diagnosis Status  1. Chronic low back pain (1ry area of Pain) (Bilateral) (R>L) w/o sciatica   2. Chronic lower extremity pain (2ry area of Pain) (Right)   3. Abnormal MRI, lumbar spine (02/16/2024)   4. Low back pain of over 3 months duration   5. Lumbar facet joint pain    Controlled Controlled Controlled   Updated  Problems: No problems updated.  Plan of Care  Problem-specific:  Assessment and Plan            Kyle Miles has a current medication list which includes the following long-term medication(s): albuterol , amlodipine , cephalexin , fluticasone , metoprolol  succinate, and omega-3 acid ethyl esters.  Pharmacotherapy (Medications Ordered): No orders of the defined types were placed in this encounter.  Orders:  No orders of the defined types were placed in this encounter.    Interventional Therapies  Risk Factors  Considerations  Medical Comorbidities:     Planned  Pending:      Under consideration:   Reassessment of the patient's chronic pain problem. Possible round of physical therapy.   Completed: (Analgesic benefit)1  Diagnostic bilateral thoracolumbar SCS trial (06/24/2024) (ineffective)  Removal of bilateral thoracolumbar SCS trial leads (07/01/2024)    Therapeutic  Palliative (PRN) options:   None established   Completed by other providers:   None reported  1(Analgesic benefit): Expressed in percentage (%). (Local anesthetic[LA] +/- sedation  L.A.Local Anesthetic  Steroid benefit  Ongoing benefit)   No follow-ups on file.    Recent Visits Date Type Provider Dept  07/01/24 Procedure visit Tanya,  Eric, MD [facility]  06/24/24 Procedure visit Tanya Eric, MD [facility]  05/29/24 Office Visit Tanya Eric, MD [facility]  Showing recent visits within past 90 days and meeting all other requirements Future Appointments Date Type Provider Dept  08/04/24 Appointment Tanya Eric, MD [facility]  Showing future appointments within next 90 days and meeting all other requirements  I discussed the assessment and treatment plan with the patient. The patient was provided an opportunity to ask questions and all were answered. The patient agreed with the plan and demonstrated an understanding of  the instructions.  Patient advised to call back or seek an in-person evaluation if the symptoms or condition worsens.  Duration of encounter: *** minutes.  Total time on encounter, as per AMA guidelines included both the face-to-face and non-face-to-face time personally spent by the physician and/or other qualified health care professional(s) on the day of the encounter (includes time in activities that require the physician or other qualified health care professional and does not include time in activities normally performed by clinical staff). Physician's time may include the following activities when performed: Preparing to see the patient (e.g., pre-charting review of records, searching for previously ordered imaging, lab work, and nerve conduction tests) Review of prior analgesic pharmacotherapies. Reviewing PMP Interpreting ordered tests (e.g., lab work, imaging, nerve conduction tests) Performing post-procedure evaluations, including interpretation of diagnostic procedures Obtaining and/or reviewing separately obtained history Performing a medically appropriate examination and/or evaluation Counseling and educating the patient/family/caregiver Ordering medications, tests, or procedures Referring and communicating with other health care professionals (when not separately reported) Documenting clinical information in the electronic or other health record Independently interpreting results (not separately reported) and communicating results to the patient/ family/caregiver Care coordination (not separately reported)  Note by: Eric DELENA Tanya, MD (TTS and AI technology used. I apologize for any typographical errors that were not detected and corrected.) Date: 08/04/2024; Time: 7:38 PM

## 2024-08-04 ENCOUNTER — Ambulatory Visit: Admitting: Pain Medicine

## 2024-08-04 ENCOUNTER — Encounter: Payer: Self-pay | Admitting: Pain Medicine

## 2024-08-04 VITALS — BP 159/94 | HR 85 | Temp 99.1°F | Resp 16 | Ht 69.0 in | Wt 285.0 lb

## 2024-08-04 DIAGNOSIS — Z9689 Presence of other specified functional implants: Secondary | ICD-10-CM | POA: Diagnosis not present

## 2024-08-04 DIAGNOSIS — M545 Low back pain, unspecified: Secondary | ICD-10-CM | POA: Insufficient documentation

## 2024-08-04 DIAGNOSIS — M47816 Spondylosis without myelopathy or radiculopathy, lumbar region: Secondary | ICD-10-CM | POA: Insufficient documentation

## 2024-08-04 DIAGNOSIS — G8929 Other chronic pain: Secondary | ICD-10-CM | POA: Diagnosis not present

## 2024-08-04 DIAGNOSIS — R937 Abnormal findings on diagnostic imaging of other parts of musculoskeletal system: Secondary | ICD-10-CM | POA: Insufficient documentation

## 2024-08-04 DIAGNOSIS — M5459 Other low back pain: Secondary | ICD-10-CM | POA: Diagnosis not present

## 2024-08-04 NOTE — Patient Instructions (Addendum)
 " ______________________________________________________________________    Procedure instructions  Stop blood-thinners  Do not eat or drink fluids (other than water) for 8 hours before your procedure  No water for 2 hours before your procedure  Take your blood pressure medicine with a sip of water  Arrive 30 minutes before your appointment  If sedation is planned, bring suitable driver. [place], [employer], & public transportation are NOT APPROVED)  Carefully read the Preparing for your procedure detailed instructions  If you have questions call us  at [phone]  Procedure appointments are for procedures only.   NO medication refills or new problem evaluations will be done on procedure days.   Only the scheduled, pre-approved procedure and side will be done.   ______________________________________________________________________     ______________________________________________________________________    Preparing for your procedure  Appointments: If you think you may not be able to keep your appointment, call 24-48 hours in advance to cancel. We need time to make it available to others.  Procedure visits are for procedures only. During your procedure appointment there will be: NO Prescription Refills*. NO medication changes or discussions*. NO discussion of disability issues*. NO unrelated pain problem evaluations*. NO evaluations to order other pain procedures*. *These will be addressed at a separate and distinct evaluation encounter on the provider's evaluation schedule and not during procedure days.  Instructions: Food intake: Avoid eating anything solid for at least 8 hours prior to your procedure. Clear liquid intake: You may take clear liquids such as water up to 2 hours prior to your procedure. (No carbonated drinks. No soda.) Transportation: Unless otherwise stated by your physician, bring a driver. (Driver cannot be a Market Researcher, Pharmacist, Community, or any other form of public  transportation.) Morning Medicines: Except for blood thinners, take all of your other morning medications with a sip of water. Make sure to take your heart and blood pressure medicines. If your blood pressure's lower number is above 100, the case will be rescheduled. Blood thinners: Make sure to stop your blood thinners as instructed.  If you take a blood thinner, but were not instructed to stop it, call our office [phone] and ask to talk to a nurse. Not stopping a blood thinner prior to certain procedures could lead to serious complications. Diabetics on insulin : Notify the staff so that you can be scheduled 1st case in the morning. If your diabetes requires high dose insulin , take only  of your normal insulin  dose the morning of the procedure and notify the staff that you have done so. Preventing infections: Shower with an antibacterial soap the morning of your procedure.  Build-up your immune system: Take 1000 mg of Vitamin C with every meal (3 times a day) the day prior to your procedure. Antibiotics: Inform the nursing staff if you are taking any antibiotics or if you have any conditions that may require antibiotics prior to procedures. (Example: recent joint implants)   Pregnancy: If you are pregnant make sure to notify the nursing staff. Not doing so may result in injury to the fetus, including death.  Sickness: If you have a cold, fever, or any active infections, call and cancel or reschedule your procedure. Receiving steroids while having an infection may result in complications. Arrival: You must be in the facility at least 30 minutes prior to your scheduled procedure. Tardiness: Your scheduled time is also the cutoff time. If you do not arrive at least 15 minutes prior to your procedure, you will be rescheduled.  Children: Do not bring any children  with you. Make arrangements to keep them home. Dress appropriately: There is always a possibility that your clothing may get soiled. Avoid  long dresses. Valuables: Do not bring any jewelry or valuables.  Reasons to call and reschedule or cancel your procedure: (Following these recommendations will minimize the risk of a serious complication.) Surgeries: Avoid having procedures within 2 weeks of any surgery. (Avoid for 2 weeks before or after any surgery). Flu Shots: Avoid having procedures within 2 weeks of a flu shots or . (Avoid for 2 weeks before or after immunizations). Barium: Avoid having a procedure within 7-10 days after having had a radiological study involving the use of radiological contrast. (Myelograms, Barium swallow or enema study). Heart attacks: Avoid any elective procedures or surgeries for the initial 6 months after a Myocardial Infarction (Heart Attack). Blood thinners: It is imperative that you stop these medications before procedures. Let us  know if you if you take any blood thinner.  Infection: Avoid procedures during or within two weeks of an infection (including chest colds or gastrointestinal problems). Symptoms associated with infections include: Localized redness, fever, chills, night sweats or profuse sweating, burning sensation when voiding, cough, congestion, stuffiness, runny nose, sore throat, diarrhea, nausea, vomiting, cold or Flu symptoms, recent or current infections. It is specially important if the infection is over the area that we intend to treat. Heart and lung problems: Symptoms that may suggest an active cardiopulmonary problem include: cough, chest pain, breathing difficulties or shortness of breath, dizziness, ankle swelling, uncontrolled high or unusually low blood pressure, and/or palpitations. If you are experiencing any of these symptoms, cancel your procedure and contact your primary care physician for an evaluation.  Remember:  Regular Business hours are:  Monday to Thursday 8:00 AM to 4:00 PM  Provider's Schedule: Eric Como, MD:  Procedure days: Tuesday and Thursday 7:30  AM to 4:00 PM  Wallie Sherry, MD:  Procedure days: Monday and Wednesday 7:30 AM to 4:00 PM Last  Updated: 06/26/2023 ______________________________________________________________________     ______________________________________________________________________    General Risks and Possible Complications  Patient Responsibilities: It is important that you read this as it is part of your informed consent. It is our duty to inform you of the risks and possible complications associated with treatments offered to you. It is your responsibility as a patient to read this and to ask questions about anything that is not clear or that you believe was not covered in this document.  Patients Rights: You have the right to refuse treatment. You also have the right to change your mind, even after initially having agreed to have the treatment done. However, under this last option, if you wait until the last second to change your mind, you may be charged for the materials used up to that point.  Introduction: Medicine is not an visual merchandiser. Everything in Medicine, including the lack of treatment(s), carries the potential for danger, harm, or loss (which is by definition: Risk). In Medicine, a complication is a secondary problem, condition, or disease that can aggravate an already existing one. All treatments carry the risk of possible complications. The fact that a side effects or complications occurs, does not imply that the treatment was conducted incorrectly. It must be clearly understood that these can happen even when everything is done following the highest safety standards.  No treatment: You can choose not to proceed with the proposed treatment alternative. The PRO(s) would include: avoiding the risk of complications associated with the therapy. The CON(s) would  include: not getting any of the treatment benefits. These benefits fall under one of three categories: diagnostic; therapeutic; and/or  palliative. Diagnostic benefits include: getting information which can ultimately lead to improvement of the disease or symptom(s). Therapeutic benefits are those associated with the successful treatment of the disease. Finally, palliative benefits are those related to the decrease of the primary symptoms, without necessarily curing the condition (example: decreasing the pain from a flare-up of a chronic condition, such as incurable terminal cancer).  General Risks and Complications: These are associated to most interventional treatments. They can occur alone, or in combination. They fall under one of the following six (6) categories: no benefit or worsening of symptoms; bleeding; infection; nerve damage; allergic reactions; and/or death. No benefits or worsening of symptoms: In Medicine there are no guarantees, only probabilities. No healthcare provider can ever guarantee that a medical treatment will work, they can only state the probability that it may. Furthermore, there is always the possibility that the condition may worsen, either directly, or indirectly, as a consequence of the treatment. Bleeding: This is more common if the patient is taking a blood thinner, either prescription or over the counter (example: Goody Powders, Fish oil, Aspirin , Garlic, etc.), or if suffering a condition associated with impaired coagulation (example: Hemophilia, cirrhosis of the liver, low platelet counts, etc.). However, even if you do not have one on these, it can still happen. If you have any of these conditions, or take one of these drugs, make sure to notify your treating physician. Infection: This is more common in patients with a compromised immune system, either due to disease (example: diabetes, cancer, human immunodeficiency virus [HIV], etc.), or due to medications or treatments (example: therapies used to treat cancer and rheumatological diseases). However, even if you do not have one on these, it can still  happen. If you have any of these conditions, or take one of these drugs, make sure to notify your treating physician. Nerve Damage: This is more common when the treatment is an invasive one, but it can also happen with the use of medications, such as those used in the treatment of cancer. The damage can occur to small secondary nerves, or to large primary ones, such as those in the spinal cord and brain. This damage may be temporary or permanent and it may lead to impairments that can range from temporary numbness to permanent paralysis and/or brain death. Allergic Reactions: Any time a substance or material comes in contact with our body, there is the possibility of an allergic reaction. These can range from a mild skin rash (contact dermatitis) to a severe systemic reaction (anaphylactic reaction), which can result in death. Death: In general, any medical intervention can result in death, most of the time due to an unforeseen complication. ______________________________________________________________________      ______________________________________________________________________    Steroid injections  Common steroids for injections Triamcinolone: Used by many sports medicine physicians for large joint and bursal injections, often combined with a local anesthetic like lidocaine . A study focusing on coccydynia (tailbone pain) found triamcinolone was more effective than betamethasone , suggesting it may also be preferable for other localized inflammation conditions. Methylprednisolone : A common alternative to triamcinolone that is also a strong anti-inflammatory. It is available in different formulations, with the acetate suspension being the long-acting option for intra-articular injections. Dexamethasone : This is a non-particulate steroid, meaning it has a lower risk of tissue damage compared to particulate steroids like triamcinolone and methylprednisolone . While less common for this specific  use, it is an option for targeted injections.   Considerations for physicians Particulate vs. non-particulate steroids: Triamcinolone and methylprednisolone  are particulate, meaning they can clump together. Dexamethasone  is non-particulate. Particulate steroids are often preferred for their longer-lasting effects but carry a theoretical higher risk for certain injections (though this is less of a concern in the costochondral joints). Combined injectate: Corticosteroids are typically mixed with a local anesthetic like lidocaine  to provide both immediate pain relief (from the anesthetic) and longer-term inflammation reduction (from the steroid). Imaging guidance: To ensure accurate placement of the needle and medication, physicians may use ultrasound or fluoroscopic guidance for the injection, especially in complex or refractory cases.   Patient guidance Before undergoing a steroid injection, discuss the options with your physician. They will determine the best steroid, dosage, and procedure for your specific case based on factors like: Severity of your condition History of response to other treatments Your overall health status Experience and preference of the physician  Last  Updated: 03/11/2024 ______________________________________________________________________      ______________________________________________________________________  Spondylolisthesis  Spondylolisthesis is a condition that occurs when a vertebra in the spine slips out of place, usually in the lower back. Symptoms can vary from mild to severe, and a person may have no symptoms.  Some common symptoms include:  Back pain, especially chronic pain  Pain that radiates down the legs  Pain that worsens with exercise  Tightness in the hamstrings  Neck stiffness  Loss of spine flexibility  Weakness in the legs or trouble walking  Numbness and tingling in the groin and/or buttocks   Some causes of spondylolisthesis  include: Birth defects. Sudden injury. Abnormal wear on the cartilage and bones, such as arthritis. Bone disease and fractures. Certain sports activities, such as gymnastics, weightlifting, and football.  A doctor can diagnose spondylolisthesis with a physical exam, X-rays, and possibly a CT scan.    Forward slippage is known as Anterolisthesis.  Backward slippage is known as Retrolisthesis.   Pathophysiology of Spondylolisthesis:   Grading Classification of Spondylolisthesis Grade I spondylolisthesis is 1 to 25% slippage, grade II is up to 50% slippage, grade III is up to 75% slippage, and grade IV is 76-100% slippage. If there is more than 100% slippage, it is known as spondyloptosis or grade V spondylolisthesis.       ______________________________________________________________________   ______________________________________________________________________    Patient information on: Body mass index (BMI) and Weight Management  Dear Mr. Prajapati you are receiving this information because your weight may be adversely affecting your health.   Your current Estimated body mass index is 42.09 kg/m as calculated from the following:   Height as of this encounter: 5' 9 (1.753 m).   Weight as of this encounter: 285 lb (129.3 kg).  We recommend you talk to your primary care physician about providing or referring you to a supervised weight management program.  Here is some information about weight and the body mass index (BMI) classification:  BMI is a measure of obesity that's calculated by dividing a person's weight in kilograms by their height in meters squared. A person can use an online calculator to determine their BMI. Body mass index (BMI) is a common tool for deciding whether a person has an appropriate body weight.  It measures a person's weight in relation to their height.  According to the [facility] of health (NIH): A BMI of less than 18.5 means that a  person is underweight. A BMI of between 18.5 and 24.9 is ideal. A  BMI of between 25 and 29.9 is overweight. A BMI over 30 indicates obesity.  Body Mass Index (BMI) Classification BMI level (kg/m2) Category Associated incidence of chronic pain  <18  Underweight   18.5-24.9 Ideal body weight   25-29.9 Overweight  20%  30-34.9 Obese (Class I)  68%  35-39.9 Severe obesity (Class II)  136%  >40 Extreme obesity (Class III)  254%    Morbidly Obese Classification: You will be considered to be Morbidly Obese if your BMI is above 30 and you have one or more of the following conditions caused or associated to obesity: 1.    Type 2 Diabetes (Leading to cardiovascular diseases (CVD), stroke, peripheral vascular diseases (PVD), retinopathy, nephropathy, and neuropathy) 2.    Cardiovascular Disease (High Blood Pressure; Congestive Heart Failure; High Cholesterol; Coronary Artery Disease; Angina; Arrhythmias, Dysrhythmias, or Heart Attacks) 3.    Breathing problems (Asthma; obesity-hypoventilation syndrome; obstructive sleep apnea; chronic inflammatory airway disease; reactive airway disease; or shortness of breath) 4.    Chronic kidney disease 5.    Liver disease (nonalcoholic fatty liver disease) 6.    High blood pressure 7.    Acid reflux (gastroesophageal reflux disease; heartburn) 8.    Osteoarthritis (OA) (affecting the hip(s), the knee(s) and/or the lower back) (usually requiring knee and/or hip replacements, as well as back surgeries) 9.    Low back pain (Lumbar Facet Syndrome; and/or Degenerative Disc Disease) 10.  Hip pain (Osteoarthritis of hip) (For every 1 lbs of added body weight, there is a 2 lbs increase in pressure inside of each hip articulation. 1:2 mechanical relationship) 11.  Knee pain (Osteoarthritis of knee) (For every 1 lbs of added body weight, there is a 4 lbs increase in pressure inside of each knee articulation. 1:4 mechanical relationship) (patients with a BMI>30 kg/m2 were  6.8 times more likely to develop knee OA than normal-weight individuals) 12.  Cancer: Epidemiological studies have shown that obesity is a risk factor for: post-menopausal breast cancer; cancers of the endometrium, colon and kidney cancer; malignant adenomas of the esophagus. Obese subjects have an approximately 1.5-3.5-fold increased risk of developing these cancers compared with normal-weight subjects, and it has been estimated that between 15 and 45% of these cancers can be attributed to overweight. More recent studies suggest that obesity may also increase the risk of other types of cancer, including pancreatic, hepatic and gallbladder cancer. (Ref: Obesity and cancer. Pischon T, Nthlings U, [employer] H. Proc Nutr Soc. 2008 May;67(2):128-45. doi: 10.1017/S0029665108006976.) The International Agency for Research on Cancer (IARC) has identified 13 cancers associated with overweight and obesity: meningioma, multiple myeloma, adenocarcinoma of the esophagus, and cancers of the thyroid , postmenopausal breast cancer, gallbladder, stomach, liver, pancreas, kidney, ovaries, uterus, colon and rectal (colorectal) cancers. 55 percent of all cancers diagnosed in women and 24 percent of those diagnosed in men are associated with overweight and obesity.  Recommendation: If you have any of the above conditions it is urgent that you take a step back and concentrate in losing weight. Dedicate 100% of your efforts on this task. Nothing else will improve your health more than bringing your weight down and your BMI to less than 30.   Nutritionist and/or supervised weight-management program: We are aware that most chronic pain patients are unable to exercise secondary to their pain. For this reason, you must rely on proper nutrition and diet in order to lose the weight. We recommend you talk to a nutritionist.   Bariatric surgery: A person might be considered  a candidate for bariatric surgery if they meet one of the following  BMI criteria:  BMI of 40 or higher: This is considered extreme obesity (Class III). BMI of 35-39.9: This is considered obesity, and the person might also have a serious weight-related health condition, such as high blood pressure, type 2 diabetes, or severe sleep apnea  BMI of 30-34.9: This might be considered if the person has serious weight-related health problems and hasn't had substantial weight loss or improvement in co-morbidities through other methods   On your own: A realistic goal is to lose 10% of your body weight over a period of 12 months.  If over a period of six (6) months you have unsuccessfully tried to lose weight, then it is time for you to seek professional help and to enter a medically supervised weight management program, and/or undergo bariatric surgery.   Pain management considerations and possible limitations:  1.    Pharmacological Problems: Be advised that the use of opioid analgesics (oxycodone; hydrocodone; morphine; methadone; codeine; and all of their derivatives) have been associated with decreased metabolism and weight gain.  For this reason, should we see that you are unable to lose weight while taking these medications, it may become necessary for us  to taper down and indefinitely discontinue them.  2.    Technical Problems: The incidence of successful interventional therapies decreases as the patient's BMI increases. It is much more difficult to accomplish a safe and effective interventional therapy on a patient with a BMI above 35. 3.    Radiation Exposure Problems: The x-rays machine, used to accomplish injection therapies, will automatically increase their x-ray output in order to capture an appropriate bone image. This means that radiation exposure increases exponentially with the patient's BMI. (The higher the BMI, the higher the radiation exposure.) Although the level of radiation used at a given time is still safe to the patient, it is not for the physician and/or  assisting staff. Unfortunately, radiation exposure is accumulative. Because physicians and the staff have to do procedures and be exposed on a daily basis, this can result in health problems such as cancer and radiation burns. Radiation exposure to the staff is monitored by the radiation batches that they wear. The exposure levels are reported back to the staff on a quarterly basis. Depending on levels of exposure, physicians and staff may be obligated by law to decrease this exposure. This means that they have the right and obligation to refuse providing therapies where they may be overexposed to radiation. For this reason, physicians may decline to offer therapies such as radiofrequency ablation or implants to patients with a BMI above 40. 4.    Current Trends: Be advised that the current trend is to no longer offer certain therapies to patients with a BMI equal to, or above 35, due to increase perioperative risks, increased technical procedural difficulties, and excessive radiation exposure to healthcare personnel.  Last updated: 04/09/2023 ______________________________________________________________________      "

## 2024-08-04 NOTE — Progress Notes (Signed)
 Safety precautions to be maintained throughout the outpatient stay will include: orient to surroundings, keep bed in low position, maintain call bell within reach at all times, provide assistance with transfer out of bed and ambulation.

## 2024-08-14 ENCOUNTER — Ambulatory Visit (HOSPITAL_BASED_OUTPATIENT_CLINIC_OR_DEPARTMENT_OTHER): Admitting: Pain Medicine

## 2024-08-14 ENCOUNTER — Encounter: Payer: Self-pay | Admitting: Pain Medicine

## 2024-08-14 ENCOUNTER — Ambulatory Visit
Admission: RE | Admit: 2024-08-14 | Discharge: 2024-08-14 | Disposition: A | Discharge status: Home / Self Care | Source: Ambulatory Visit | Attending: Pain Medicine | Admitting: Pain Medicine

## 2024-08-14 VITALS — BP 128/78 | HR 94 | Resp 16

## 2024-08-14 DIAGNOSIS — Z9689 Presence of other specified functional implants: Secondary | ICD-10-CM | POA: Diagnosis present

## 2024-08-14 DIAGNOSIS — M545 Low back pain, unspecified: Secondary | ICD-10-CM | POA: Insufficient documentation

## 2024-08-14 DIAGNOSIS — Z5189 Encounter for other specified aftercare: Secondary | ICD-10-CM | POA: Insufficient documentation

## 2024-08-14 DIAGNOSIS — G8929 Other chronic pain: Secondary | ICD-10-CM | POA: Insufficient documentation

## 2024-08-14 DIAGNOSIS — R937 Abnormal findings on diagnostic imaging of other parts of musculoskeletal system: Secondary | ICD-10-CM

## 2024-08-14 DIAGNOSIS — M47816 Spondylosis without myelopathy or radiculopathy, lumbar region: Secondary | ICD-10-CM | POA: Diagnosis present

## 2024-08-14 DIAGNOSIS — M5459 Other low back pain: Secondary | ICD-10-CM

## 2024-08-14 MED ORDER — ROPIVACAINE HCL 2 MG/ML IJ SOLN
18.0000 mL | Freq: Once | INTRAMUSCULAR | Status: AC
  Administered 2024-08-14: 18 mL via PERINEURAL
  Filled 2024-08-14: qty 20

## 2024-08-14 MED ORDER — MIDAZOLAM HCL (PF) 2 MG/2ML IJ SOLN
0.5000 mg | Freq: Once | INTRAMUSCULAR | Status: AC
  Administered 2024-08-14: 2 mg via INTRAVENOUS
  Filled 2024-08-14: qty 2

## 2024-08-14 MED ORDER — CEFAZOLIN SODIUM-DEXTROSE 1-4 GM/50ML-% IV SOLN: 1.0000 g | Freq: Once | INTRAVENOUS | Status: DC

## 2024-08-14 MED ORDER — DIPHENHYDRAMINE HCL 50 MG/ML IJ SOLN: 12.5000 mg | Freq: Once | INTRAMUSCULAR | Status: DC

## 2024-08-14 MED ORDER — FENTANYL CITRATE (PF) 100 MCG/2ML IJ SOLN
25.0000 ug | INTRAMUSCULAR | Status: DC | PRN
  Administered 2024-08-14: 50 ug via INTRAVENOUS
  Filled 2024-08-14: qty 2

## 2024-08-14 MED ORDER — TRIAMCINOLONE ACETONIDE 40 MG/ML IJ SUSP
80.0000 mg | Freq: Once | INTRAMUSCULAR | Status: AC
  Administered 2024-08-14: 80 mg
  Filled 2024-08-14: qty 2

## 2024-08-14 MED ORDER — PENTAFLUOROPROP-TETRAFLUOROETH EX AERO
INHALATION_SPRAY | Freq: Once | CUTANEOUS | Status: AC
  Administered 2024-08-14: 30 via TOPICAL

## 2024-08-14 MED ORDER — LIDOCAINE HCL 2 % IJ SOLN
20.0000 mL | Freq: Once | INTRAMUSCULAR | Status: AC
  Administered 2024-08-14: 400 mg
  Filled 2024-08-14: qty 20

## 2024-08-14 MED ORDER — CEFAZOLIN SODIUM 1 G IJ SOLR
INTRAMUSCULAR | Status: AC
  Filled 2024-08-14: qty 10

## 2024-08-14 NOTE — Progress Notes (Signed)
 PROVIDER NOTE: Interpretation of information contained herein should be left to medically-trained personnel. Specific patient instructions are provided elsewhere under Patient Instructions section of medical record. This document was created in part using STT-dictation technology, any transcriptional errors that may result from this process are unintentional.  Patient: Kyle Miles Type: Established DOB: 1970/12/20 MRN: [id number] PCP: Edman Marsa PARAS, DO  Service: Procedure DOS: 08/14/2024 Setting: Ambulatory Location: Ambulatory outpatient facility Delivery: Face-to-face Provider: Eric DELENA Como, MD Specialty: Interventional Pain Management Specialty designation: 09 Location: Outpatient facility Ref. Prov.: Como Eric, MD       Interventional Therapy   Type: Lumbar Facet, Medial Branch Block(s) (w/ fluoroscopic mapping) #1  Laterality: Bilateral  Level: L3, L4, L5, and S1 Medial Branch/Dorsal Rami Level(s). Injecting these levels blocks the L4-5 and L5-S1 lumbar facet joints.  (L4-5 done only on the right side) Imaging: Fluoroscopic guidance Spinal ([id number]) Anesthesia: Local anesthesia (1-2% Lidocaine ) Anxiolysis: IV Versed  2.0 mg            Analgesia: No Sedation Fentanyl  1 mL (50 mcg) DOS: 08/14/2024 Performed by: Eric DELENA Como, MD  Primary Purpose: Diagnostic/Therapeutic Indications: Low back pain severe enough to impact quality of life or function. 1. Chronic low back pain (1ry area of Pain) (Bilateral) (R>L) w/o sciatica   2. Low back pain of over 3 months duration   3. Lumbar facet joint pain   4. Lumbar facet arthropathy   5. Abnormal MRI, lumbar spine (02/16/2024)   6. Multifactorial low back pain   7. History of FAILED spinal cord stimulator TRIAL   8. Need for prophylactic antibiotic   9. Encounter for therapeutic procedure    NAS-11 Pain score:   Pre-procedure: 7 /10   Post-procedure: 6 /10     Position / Prep / Materials:   Position: Prone  Prep solution: ChloraPrep (2% chlorhexidine gluconate and 70% isopropyl alcohol) Area Prepped: Posterolateral Lumbosacral Spine (Wide prep: From the lower border of the scapula down to the end of the tailbone and from flank to flank.)  Materials:  Tray: Block Needle(s):  Type: Spinal  Gauge (G): 22  Length: 5-in Qty: 4     H&P (Pre-op Assessment):  Kyle Miles is a 54 y.o. (year old), male patient, seen today for interventional treatment. He  has no past surgical history on file. Kyle Miles has a current medication list which includes the following prescription(s): albuterol , amlodipine , ascorbic acid , cephalexin , cinnamon, fluticasone , metoprolol  succinate, multi-vitamin, omega-3 acid ethyl esters, tramadol , and zinc  sulfate (50mg  elemental zinc ), and the following Facility-Administered Medications: fentanyl . His primarily concern today is the Back Pain (Bilateral lumbar )  Initial Vital Signs:  Pulse/HCG Rate: 94ECG Heart Rate: 91 Temp:   Resp: 18 BP: (!) 152/109 SpO2: 95 %  BMI: Estimated body mass index is 42.09 kg/m as calculated from the following:   Height as of 08/04/24: 5' 9 (1.753 m).   Weight as of 08/04/24: 285 lb (129.3 kg).  Risk Assessment: Allergies: Reviewed. He has no known allergies.  Allergy Precautions: None required Coagulopathies: Reviewed. None identified.  Blood-thinner therapy: None at this time Active Infection(s): Reviewed. None identified. Kyle Miles is afebrile  Site Confirmation: Kyle Miles was asked to confirm the procedure and laterality before marking the site Procedure checklist: Completed Consent: Before the procedure and under the influence of no sedative(s), amnesic(s), or anxiolytics, the patient was informed of the treatment options, risks and possible complications. To fulfill our ethical and legal obligations, as recommended by the American Medical Association's  Code of Ethics, I have informed the patient of my clinical  impression; the nature and purpose of the treatment or procedure; the risks, benefits, and possible complications of the intervention; the alternatives, including doing nothing; the risk(s) and benefit(s) of the alternative treatment(s) or procedure(s); and the risk(s) and benefit(s) of doing nothing. The patient was provided information about the general risks and possible complications associated with the procedure. These may include, but are not limited to: failure to achieve desired goals, infection, bleeding, organ or nerve damage, allergic reactions, paralysis, and death. In addition, the patient was informed of those risks and complications associated to Spine-related procedures, such as failure to decrease pain; infection (i.e.: Meningitis, epidural or intraspinal abscess); bleeding (i.e.: epidural hematoma, subarachnoid hemorrhage, or any other type of intraspinal or peri-dural bleeding); organ or nerve damage (i.e.: Any type of peripheral nerve, nerve root, or spinal cord injury) with subsequent damage to sensory, motor, and/or autonomic systems, resulting in permanent pain, numbness, and/or weakness of one or several areas of the body; allergic reactions; (i.e.: anaphylactic reaction); and/or death. Furthermore, the patient was informed of those risks and complications associated with the medications. These include, but are not limited to: allergic reactions (i.e.: anaphylactic or anaphylactoid reaction(s)); adrenal axis suppression; blood sugar elevation that in diabetics may result in ketoacidosis or comma; water retention that in patients with history of congestive heart failure may result in shortness of breath, pulmonary edema, and decompensation with resultant heart failure; weight gain; swelling or edema; medication-induced neural toxicity; particulate matter embolism and blood vessel occlusion with resultant organ, and/or nervous system infarction; and/or aseptic necrosis of one or more  joints. Finally, the patient was informed that Medicine is not an exact science; therefore, there is also the possibility of unforeseen or unpredictable risks and/or possible complications that may result in a catastrophic outcome. The patient indicated having understood very clearly. We have given the patient no guarantees and we have made no promises. Enough time was given to the patient to ask questions, all of which were answered to the patient's satisfaction. Mr. Mcfayden has indicated that he wanted to continue with the procedure. Attestation: I, the ordering provider, attest that I have discussed with the patient the benefits, risks, side-effects, alternatives, likelihood of achieving goals, and potential problems during recovery for the procedure that I have provided informed consent. Date  Time:   Pre-Procedure Preparation:  Monitoring: As per clinic protocol. Respiration, ETCO2, SpO2, BP, heart rate and rhythm monitor placed and checked for adequate function Safety Precautions: Patient was assessed for positional comfort and pressure points before starting the procedure. Time-out: I initiated and conducted the Time-out before starting the procedure, as per protocol. The patient was asked to participate by confirming the accuracy of the Time Out information. Verification of the correct person, site, and procedure were performed and confirmed by me, the nursing staff, and the patient. Time-out conducted as per Joint Commission's Universal Protocol (UP.01.01.01). Time: 1104 Start Time: 1104 hrs.  Description of Procedure:          Laterality: (see above) Targeted Levels: (see above)  Safety Precautions: Aspiration looking for blood return was conducted prior to all injections. At no point did we inject any substances, as a needle was being advanced. Before injecting, the patient was told to immediately notify me if he was experiencing any new onset of ringing in the ears, or metallic  taste in the mouth. No attempts were made at seeking any paresthesias. Safe injection practices and needle  disposal techniques used. Medications properly checked for expiration dates. SDV (single dose vial) medications used. After the completion of the procedure, all disposable equipment used was discarded in the proper designated medical waste containers. Local Anesthesia: Protocol guidelines were followed. The patient was positioned over the fluoroscopy table. The area was prepped in the usual manner. The time-out was completed. The target area was identified using fluoroscopy. A 12-in long, straight, sterile hemostat was used with fluoroscopic guidance to locate the targets for each level blocked. Once located, the skin was marked with an approved surgical skin marker. Once all sites were marked, the skin (epidermis, dermis, and hypodermis), as well as deeper tissues (fat, connective tissue and muscle) were infiltrated with a small amount of a short-acting local anesthetic, loaded on a 10cc syringe with a 25G, 1.5-in  Needle. An appropriate amount of time was allowed for local anesthetics to take effect before proceeding to the next step. Local Anesthetic: Lidocaine  2.0% The unused portion of the local anesthetic was discarded in the proper designated containers. Technical description of process:  Medial Branch  Dorsal Rami Nerve Block (MBB):  Neuroanatomy note: Each lumbar facet joint receives dual innervation from medial branches arising from the posterior primary rami at the same level and one level above. The target for each lumbar medial branch is the junction of the ipsilateral superior articular and transverse process of the lower vertebral body. (i.e.: The L4-L5 facet joint is innervated by the L4 medial branch [located at L5] and the L3 medial branch [located at L4]. Blocking the L4 Medial Branch is therefore achieved by injecting at the junction of the ipsilateral superior articular and  transverse process of the lower vertebral body [L5].).  Exception: The exception to the above rule is the L5-S1 facet joint which has triple innervation requiring the L4 medial branch, as well as the L5 and the S1 Dorsal Rami(s) to be blocked to fully denervate the joint.  Under fluoroscopic guidance, a needle was inserted until contact was made with os over the target area. After negative aspiration, 0.5 mL of the nerve block solution was injected without difficulty or complication. Paresthesia were avoided during injection. The needle(s) were removed intact and without complication.  Once the entire procedure was completed, the treated area was cleaned, making sure to leave some of the prepping solution back to take advantage of its long term bactericidal properties.         Illustration of the posterior view of the lumbar spine and the posterior neural structures. Laminae of L2 through S1 are labeled. DPRL5, dorsal primary ramus of L5; DPRS1, dorsal primary ramus of S1; DPR3, dorsal primary ramus of L3; FJ, facet (zygapophyseal) joint L3-L4; I, inferior articular process of L4; LB1, lateral branch of dorsal primary ramus of L1; IAB, inferior articular branches from L3 medial branch (supplies L4-L5 facet joint); IBP, intermediate branch plexus; MB3, medial branch of dorsal primary ramus of L3; NR3, third lumbar nerve root; S, superior articular process of L5; SAB, superior articular branches from L4 (supplies L4-5 facet joint also); TP3, transverse process of L3.   Facet Joint Innervation (* possible contribution)  L1-2 T12, L1 (L2*)  Medial Branch  L2-3 L1, L2 (L3*)                     L3-4 L2, L3 (L4*)                     L4-5 L3, L4 (L5*)  L5-S1 L4, L5, S1                        Vitals:   08/14/24 1105 08/14/24 1111 08/14/24 1113 08/14/24 1119  BP: (!) 133/108 (!) 137/93 (!) 137/93 128/78  Pulse:    94  Resp: 15 15 18 16   SpO2: 97% 95% 95% 95%     End  Time: 1113 hrs.  Imaging Guidance (Spinal):         Type of Imaging Technique: Fluoroscopy Guidance (Spinal) Indication(s): Fluoroscopy guidance for needle placement to enhance accuracy in procedures requiring precise needle localization for targeted delivery of medication in or near specific anatomical locations not easily accessible without such real-time imaging assistance. Exposure Time: Please see nurses notes. Contrast: None used. Fluoroscopic Guidance: I was personally present during the use of fluoroscopy. Tunnel Vision Technique used to obtain the best possible view of the target area. Parallax error corrected before commencing the procedure. Direction-depth-direction technique used to introduce the needle under continuous pulsed fluoroscopy. Once target was reached, antero-posterior, oblique, and lateral fluoroscopic projection used confirm needle placement in all planes. Images permanently stored in EMR. Interpretation: No contrast injected. I personally interpreted the imaging intraoperatively. Adequate needle placement confirmed in multiple planes. Permanent images saved into the patient's record.  Post-operative Assessment:  Post-procedure Vital Signs:  Pulse/HCG Rate: 9493 Temp:   Resp: 16 BP: 128/78 SpO2: 95 %  EBL: None  Complications: No immediate post-treatment complications observed by team, or reported by patient.  Note: The patient tolerated the entire procedure well. A repeat set of vitals were taken after the procedure and the patient was kept under observation following institutional policy, for this type of procedure. Post-procedural neurological assessment was performed, showing return to baseline, prior to discharge. The patient was provided with post-procedure discharge instructions, including a section on how to identify potential problems. Should any problems arise concerning this procedure, the patient was given instructions to immediately contact us , at any  time, without hesitation. In any case, we plan to contact the patient by telephone for a follow-up status report regarding this interventional procedure.  Comments:  No additional relevant information.  Plan of Care (POC)  Orders:  Orders Placed This Encounter  Procedures   LUMBAR FACET(MEDIAL BRANCH NERVE BLOCK) MBNB    Scheduling Instructions:     Procedure: Lumbar facet block (AKA.: Lumbosacral medial branch nerve block)     Side: Bilateral     Level: L3-4, L4-5, L5-S1, and TBD Facets (L2, L3, L4, L5, S1, and TBD Medial Branch Nerves)     Procedural Analgesia/Anxiolysis: Patient's choice     Date: 08/14/2024    Where will this procedure be performed?:   [facility]   [facility] C-ARM 1-60 MIN NO REPORT    Intraoperative interpretation by procedural physician at [facility].    Standing Status:   Standing    Number of Occurrences:   1    Reason for exam::   Assistance in needle guidance and placement for procedures requiring needle placement in or near specific anatomical locations not easily accessible without such assistance.   Informed Consent Details: Physician/Practitioner Attestation; Transcribe to consent form and obtain patient signature    Nursing Order: Transcribe to consent form and obtain patient signature. Note: Always confirm laterality of pain with Mr. Swatzell, before procedure.    Physician/Practitioner attestation of informed consent for procedure/surgical case:   I, the physician/practitioner, attest that I have discussed  with the patient the benefits, risks, side effects, alternatives, likelihood of achieving goals and potential problems during recovery for the procedure that I have provided informed consent.    Procedure:   Lumbar Facet Block  under fluoroscopic guidance    Physician/Practitioner performing the procedure:   Yunuen Mordan A. Tanya MD    Indication/Reason:   Low Back Pain, with our without leg pain, due to Facet Joint Arthralgia  (Joint Pain) Spondylosis (Arthritis of the Spine), without myelopathy or radiculopathy (Nerve Damage).   Provide equipment / supplies at bedside    Procedure tray: Block Tray (Disposable  single use) Skin infiltration needle: Regular 1.5-in, 25-G, (x1) Block Needle type: Spinal Amount/quantity: 4 Size: Medium (5-inch) Gauge: 22G    Standing Status:   Standing    Number of Occurrences:   1    Specify:   Block Tray   Saline lock IV    Have LR [phone] mL available and administer at 125 mL/hr if patient becomes hypotensive.    Standing Status:   Standing    Number of Occurrences:   1     Opioid Analgesic: None MME/day: 0 mg/day    Medications ordered for procedure: Meds ordered this encounter  Medications   lidocaine  (XYLOCAINE ) 2 % (with pres) injection 400 mg   pentafluoroprop-tetrafluoroeth (GEBAUERS) aerosol   midazolam  PF (VERSED ) injection 0.5-2 mg    Make sure Flumazenil is available in the pyxis when using this medication. If oversedation occurs, administer 0.2 mg IV over 15 sec. If after 45 sec no response, administer 0.2 mg again over 1 min; may repeat at 1 min intervals; not to exceed 4 doses (1 mg)   fentaNYL  (SUBLIMAZE ) injection 25-50 mcg    Make sure Narcan is available in the pyxis when using this medication. In the event of respiratory depression (RR< 8/min): Titrate NARCAN (naloxone) in increments of 0.1 to 0.2 mg IV at 2-3 minute intervals, until desired degree of reversal.   ropivacaine  (PF) 2 mg/mL (0.2%) (NAROPIN ) injection 18 mL   triamcinolone  acetonide (KENALOG -40) injection 80 mg   DISCONTD: diphenhydrAMINE  (BENADRYL ) injection 12.5 mg   DISCONTD: ceFAZolin  (ANCEF ) IVPB 1 g/50 mL premix    Antibiotic Indication::   Surgical Prophylaxis    Other Indication::   Procedure Prophylaxis   Medications administered: We administered lidocaine , pentafluoroprop-tetrafluoroeth, midazolam  PF, fentaNYL , ropivacaine  (PF) 2 mg/mL (0.2%), and triamcinolone  acetonide.   See the medical record for exact dosing, route, and time of administration.    Interventional Therapies  Risk Factors  Considerations  Medical Comorbidities:     Planned  Pending:   Diagnostic bilateral lumbar facet MBB #1    Under consideration:   Diagnostic bilateral lumbar facet MBB #1    Completed: (Analgesic benefit)1  Diagnostic bilateral thoracolumbar SCS trial (06/24/2024) (ineffective)  Removal of bilateral thoracolumbar SCS trial leads (07/01/2024)    Therapeutic  Palliative (PRN) options:   None established   Completed by other providers:   None reported  1(Analgesic benefit): Expressed in percentage (%). (Local anesthetic[LA] +/- sedation  L.A.Local Anesthetic  Steroid benefit  Ongoing benefit)    Follow-up plan:   Return in about 2 weeks (around 08/28/2024) for (Face2F), (PPE).     Recent Visits Date Type Provider Dept  08/04/24 Office Visit Tanya Glisson, MD [facility]  07/01/24 Procedure visit Tanya Glisson, MD [facility]  06/24/24 Procedure visit Tanya Glisson, MD [facility]  05/29/24 Office Visit Tanya Glisson, MD [facility]  Showing recent visits  within past 90 days and meeting all other requirements Today's Visits Date Type Provider Dept  08/14/24 Procedure visit Tanya Glisson, MD [facility]  Showing today's visits and meeting all other requirements Future Appointments Date Type Provider Dept  09/03/24 Appointment Tanya Glisson, MD [facility]  Showing future appointments within next 90 days and meeting all other requirements   Disposition: Discharge home  Discharge (Date  Time): 08/14/2024;   hrs.   Primary Care Physician: Edman Marsa PARAS, DO Location: [facility] Note by: Glisson DELENA Tanya, MD (TTS technology used. I apologize for any typographical errors that were not detected and corrected.) Date:  08/14/2024; Time: 11:39 AM  Disclaimer:  Medicine is not an visual merchandiser. The only guarantee in medicine is that nothing is guaranteed. It is important to note that the decision to proceed with this intervention was based on the information collected from the patient. The Data and conclusions were drawn from the patient's questionnaire, the interview, and the physical examination. Because the information was provided in large part by the patient, it cannot be guaranteed that it has not been purposely or unconsciously manipulated. Every effort has been made to obtain as much relevant data as possible for this evaluation. It is important to note that the conclusions that lead to this procedure are derived in large part from the available data. Always take into account that the treatment will also be dependent on availability of resources and existing treatment guidelines, considered by other Pain Management Practitioners as being common knowledge and practice, at the time of the intervention. For Medico-Legal purposes, it is also important to point out that variation in procedural techniques and pharmacological choices are the acceptable norm. The indications, contraindications, technique, and results of the above procedure should only be interpreted and judged by a Board-Certified Interventional Pain Specialist with extensive familiarity and expertise in the same exact procedure and technique.

## 2024-08-14 NOTE — Progress Notes (Signed)
 Safety precautions to be maintained throughout the outpatient stay will include: orient to surroundings, keep bed in low position, maintain call bell within reach at all times, provide assistance with transfer out of bed and ambulation.

## 2024-08-14 NOTE — Patient Instructions (Signed)
 ______________________________________________________________________    Post-Procedure Discharge Instructions  INSTRUCTIONS Apply ice:  Purpose: This will minimize any swelling and discomfort after procedure.  When: Day of procedure, as soon as you get home. How: Fill a plastic sandwich bag with crushed ice. Cover it with a small towel and apply to injection site. How long: (15 min on, 15 min off) Apply for 15 minutes then remove x 15 minutes.  Repeat sequence on day of procedure, until you go to bed. Apply heat:  Purpose: To treat any soreness and discomfort from the procedure. When: Starting the next day after the procedure. How: Apply heat to procedure site starting the day following the procedure. How long: May continue to repeat daily, until discomfort goes away. Food intake: Start with clear liquids (like water) and advance to regular food, as tolerated.  Physical activities: Keep activities to a minimum for the first 8 hours after the procedure. After that, then as tolerated. Driving: If you have received any sedation, be responsible and do not drive. You are not allowed to drive for 24 hours after having sedation. Blood thinner: (Applies only to those taking blood thinners) You may restart your blood thinner 6 hours after your procedure. Insulin: (Applies only to Diabetic patients taking insulin) As soon as you can eat, you may resume your normal dosing schedule. Infection prevention: Keep procedure site clean and dry. Shower daily and clean area with soap and water.  PAIN DIARY Post-procedure Pain Diary: Extremely important that this be done correctly and accurately. Recorded information will be used to determine the next step in treatment. For the purpose of accuracy, follow these rules: Evaluate only the area treated. Do not report or include pain from an untreated area. For the purpose of this evaluation, ignore all other areas of pain, except for the treated area. After your  procedure, avoid taking a long nap and attempting to complete the pain diary after you wake up. Instead, set your alarm clock to go off every hour, on the hour, for the initial 8 hours after the procedure. Document the duration of the numbing medicine, and the relief you are getting from it. Do not go to sleep and attempt to complete it later. It will not be accurate. If you received sedation, it is likely that you were given a medication that may cause amnesia. Because of this, completing the diary at a later time may cause the information to be inaccurate. This information is needed to plan your care. Follow-up appointment: Keep your post-procedure follow-up evaluation appointment after the procedure (usually 2 weeks for most procedures, 6 weeks for radiofrequencies). DO NOT FORGET to bring you pain diary with you.   EXPECT... (What should I expect to see with my procedure?) From numbing medicine (AKA: Local Anesthetics): Numbness or decrease in pain. You may also experience some weakness, which if present, could last for the duration of the local anesthetic. Onset: Full effect within 15 minutes of injected. Duration: It will depend on the type of local anesthetic used. On the average, 1 to 8 hours.  From steroids (Applies only if steroids were used): Decrease in swelling or inflammation. Once inflammation is improved, relief of the pain will follow. Onset of benefits: Depends on the amount of swelling present. The more swelling, the longer it will take for the benefits to be seen. In some cases, up to 10 days. Duration: Steroids will stay in the system x 2 weeks. Duration of benefits will depend on multiple posibilities including persistent irritating  factors. Side-effects: If present, they may typically last 2 weeks (the duration of the steroids). Frequent: Cramps (if they occur, drink Gatorade and take over-the-counter Magnesium 450-500 mg once to twice a day); water retention with temporary weight  gain; increases in blood sugar; decreased immune system response; increased appetite. Occasional: Facial flushing (red, warm cheeks); mood swings; menstrual changes. Uncommon: Long-term decrease or suppression of natural hormones; bone thinning. (These are more common with higher doses or more frequent use. This is why we prefer that our patients avoid having any injection therapies in other practices.)  Very Rare: Severe mood changes; psychosis; aseptic necrosis. From procedure: Some discomfort is to be expected once the numbing medicine wears off. This should be minimal if ice and heat are applied as instructed.  CALL IF... (When should I call?) You experience numbness and weakness that gets worse with time, as opposed to wearing off. New onset bowel or bladder incontinence. (Applies only to procedures done in the spine)  Emergency Numbers: Durning business hours (Monday - Thursday, 8:00 AM - 4:00 PM) (Friday, 9:00 AM - 12:00 Noon): [phone] After hours: [phone] NOTE: If you are having a problem and are unable connect with, or to talk to a provider, then go to your nearest urgent care or emergency department. If the problem is serious and urgent, please call 911. ______________________________________________________________________     ______________________________________________________________________    Steroid injections  Common steroids for injections Triamcinolone : Used by many sports medicine physicians for large joint and bursal injections, often combined with a local anesthetic like lidocaine . A study focusing on coccydynia (tailbone pain) found triamcinolone  was more effective than betamethasone, suggesting it may also be preferable for other localized inflammation conditions. Methylprednisolone: A common alternative to triamcinolone  that is also a strong anti-inflammatory. It is available in different formulations, with the acetate suspension being the long-acting  option for intra-articular injections. Dexamethasone : This is a non-particulate steroid, meaning it has a lower risk of tissue damage compared to particulate steroids like triamcinolone  and methylprednisolone. While less common for this specific use, it is an option for targeted injections.   Considerations for physicians Particulate vs. non-particulate steroids: Triamcinolone  and methylprednisolone are particulate, meaning they can clump together. Dexamethasone  is non-particulate. Particulate steroids are often preferred for their longer-lasting effects but carry a theoretical higher risk for certain injections (though this is less of a concern in the costochondral joints). Combined injectate: Corticosteroids are typically mixed with a local anesthetic like lidocaine  to provide both immediate pain relief (from the anesthetic) and longer-term inflammation reduction (from the steroid). Imaging guidance: To ensure accurate placement of the needle and medication, physicians may use ultrasound or fluoroscopic guidance for the injection, especially in complex or refractory cases.   Patient guidance Before undergoing a steroid injection, discuss the options with your physician. They will determine the best steroid, dosage, and procedure for your specific case based on factors like: Severity of your condition History of response to other treatments Your overall health status Experience and preference of the physician  Last  Updated: 03/11/2024 ______________________________________________________________________

## 2024-09-03 ENCOUNTER — Ambulatory Visit: Admitting: Pain Medicine
